# Patient Record
Sex: Male | Born: 1945 | Race: White | Hispanic: No | Marital: Married | Smoking: Former smoker
Health system: Southern US, Community
[De-identification: ages and names within clinical notes are randomized; demographics above are authoritative.]

## PROBLEM LIST (undated history)

## (undated) DIAGNOSIS — I10 Essential (primary) hypertension: Secondary | ICD-10-CM

## (undated) DIAGNOSIS — IMO0002 Reserved for concepts with insufficient information to code with codable children: Secondary | ICD-10-CM

## (undated) DIAGNOSIS — Z8619 Personal history of other infectious and parasitic diseases: Secondary | ICD-10-CM

## (undated) DIAGNOSIS — C189 Malignant neoplasm of colon, unspecified: Secondary | ICD-10-CM

## (undated) HISTORY — DX: Personal history of other infectious and parasitic diseases: Z86.19

## (undated) HISTORY — DX: Essential (primary) hypertension: I10

## (undated) HISTORY — PX: TOTAL THYMECTOMY: SHX2546

## (undated) HISTORY — PX: COLON SURGERY: SHX602

## (undated) HISTORY — DX: Reserved for concepts with insufficient information to code with codable children: IMO0002

---

## 1990-11-28 DIAGNOSIS — C189 Malignant neoplasm of colon, unspecified: Secondary | ICD-10-CM

## 1990-11-28 HISTORY — DX: Malignant neoplasm of colon, unspecified: C18.9

## 2004-11-04 ENCOUNTER — Ambulatory Visit: Payer: Self-pay | Admitting: Neurology

## 2004-11-12 ENCOUNTER — Ambulatory Visit: Payer: Self-pay | Admitting: Oncology

## 2004-11-28 ENCOUNTER — Ambulatory Visit: Payer: Self-pay | Admitting: Oncology

## 2005-01-19 ENCOUNTER — Ambulatory Visit: Payer: Self-pay | Admitting: Oncology

## 2005-08-05 ENCOUNTER — Ambulatory Visit: Payer: Self-pay | Admitting: Oncology

## 2005-08-28 ENCOUNTER — Ambulatory Visit: Payer: Self-pay | Admitting: Oncology

## 2006-09-22 ENCOUNTER — Ambulatory Visit: Payer: Self-pay | Admitting: Oncology

## 2006-09-28 ENCOUNTER — Ambulatory Visit: Payer: Self-pay | Admitting: Oncology

## 2007-11-29 ENCOUNTER — Ambulatory Visit: Payer: Self-pay | Admitting: Oncology

## 2007-12-06 ENCOUNTER — Ambulatory Visit: Payer: Self-pay | Admitting: Oncology

## 2007-12-30 ENCOUNTER — Ambulatory Visit: Payer: Self-pay | Admitting: Oncology

## 2008-01-11 ENCOUNTER — Ambulatory Visit: Payer: Self-pay | Admitting: Unknown Physician Specialty

## 2009-02-26 DIAGNOSIS — D4989 Neoplasm of unspecified behavior of other specified sites: Secondary | ICD-10-CM | POA: Insufficient documentation

## 2011-09-30 ENCOUNTER — Ambulatory Visit: Payer: Self-pay | Admitting: Family Medicine

## 2012-01-19 DIAGNOSIS — R918 Other nonspecific abnormal finding of lung field: Secondary | ICD-10-CM | POA: Diagnosis not present

## 2012-01-19 DIAGNOSIS — C37 Malignant neoplasm of thymus: Secondary | ICD-10-CM | POA: Diagnosis not present

## 2012-01-19 DIAGNOSIS — D150 Benign neoplasm of thymus: Secondary | ICD-10-CM | POA: Diagnosis not present

## 2012-03-09 DIAGNOSIS — I10 Essential (primary) hypertension: Secondary | ICD-10-CM | POA: Diagnosis not present

## 2012-03-09 DIAGNOSIS — Z23 Encounter for immunization: Secondary | ICD-10-CM | POA: Diagnosis not present

## 2012-03-09 DIAGNOSIS — R7309 Other abnormal glucose: Secondary | ICD-10-CM | POA: Diagnosis not present

## 2012-08-08 DIAGNOSIS — G7000 Myasthenia gravis without (acute) exacerbation: Secondary | ICD-10-CM | POA: Diagnosis not present

## 2012-08-09 DIAGNOSIS — M25569 Pain in unspecified knee: Secondary | ICD-10-CM | POA: Diagnosis not present

## 2012-08-09 DIAGNOSIS — G7000 Myasthenia gravis without (acute) exacerbation: Secondary | ICD-10-CM | POA: Diagnosis not present

## 2012-08-09 DIAGNOSIS — R7309 Other abnormal glucose: Secondary | ICD-10-CM | POA: Diagnosis not present

## 2012-08-09 DIAGNOSIS — I10 Essential (primary) hypertension: Secondary | ICD-10-CM | POA: Diagnosis not present

## 2012-10-12 DIAGNOSIS — H109 Unspecified conjunctivitis: Secondary | ICD-10-CM | POA: Diagnosis not present

## 2012-10-12 DIAGNOSIS — Z23 Encounter for immunization: Secondary | ICD-10-CM | POA: Diagnosis not present

## 2012-10-12 DIAGNOSIS — M25569 Pain in unspecified knee: Secondary | ICD-10-CM | POA: Diagnosis not present

## 2012-10-12 DIAGNOSIS — R7309 Other abnormal glucose: Secondary | ICD-10-CM | POA: Diagnosis not present

## 2012-10-12 DIAGNOSIS — G7000 Myasthenia gravis without (acute) exacerbation: Secondary | ICD-10-CM | POA: Diagnosis not present

## 2013-01-18 DIAGNOSIS — M1990 Unspecified osteoarthritis, unspecified site: Secondary | ICD-10-CM | POA: Diagnosis not present

## 2013-02-15 DIAGNOSIS — Z800 Family history of malignant neoplasm of digestive organs: Secondary | ICD-10-CM | POA: Diagnosis not present

## 2013-02-15 DIAGNOSIS — Z85048 Personal history of other malignant neoplasm of rectum, rectosigmoid junction, and anus: Secondary | ICD-10-CM | POA: Diagnosis not present

## 2013-03-12 DIAGNOSIS — M5430 Sciatica, unspecified side: Secondary | ICD-10-CM | POA: Diagnosis not present

## 2013-03-12 DIAGNOSIS — S336XXA Sprain of sacroiliac joint, initial encounter: Secondary | ICD-10-CM | POA: Diagnosis not present

## 2013-03-12 DIAGNOSIS — M5137 Other intervertebral disc degeneration, lumbosacral region: Secondary | ICD-10-CM | POA: Diagnosis not present

## 2013-03-12 DIAGNOSIS — M999 Biomechanical lesion, unspecified: Secondary | ICD-10-CM | POA: Diagnosis not present

## 2013-03-13 DIAGNOSIS — S336XXA Sprain of sacroiliac joint, initial encounter: Secondary | ICD-10-CM | POA: Diagnosis not present

## 2013-03-13 DIAGNOSIS — M5430 Sciatica, unspecified side: Secondary | ICD-10-CM | POA: Diagnosis not present

## 2013-03-13 DIAGNOSIS — M5137 Other intervertebral disc degeneration, lumbosacral region: Secondary | ICD-10-CM | POA: Diagnosis not present

## 2013-03-13 DIAGNOSIS — M999 Biomechanical lesion, unspecified: Secondary | ICD-10-CM | POA: Diagnosis not present

## 2013-03-14 DIAGNOSIS — M999 Biomechanical lesion, unspecified: Secondary | ICD-10-CM | POA: Diagnosis not present

## 2013-03-14 DIAGNOSIS — M5430 Sciatica, unspecified side: Secondary | ICD-10-CM | POA: Diagnosis not present

## 2013-03-14 DIAGNOSIS — S336XXA Sprain of sacroiliac joint, initial encounter: Secondary | ICD-10-CM | POA: Diagnosis not present

## 2013-03-14 DIAGNOSIS — M5137 Other intervertebral disc degeneration, lumbosacral region: Secondary | ICD-10-CM | POA: Diagnosis not present

## 2013-04-03 DIAGNOSIS — IMO0002 Reserved for concepts with insufficient information to code with codable children: Secondary | ICD-10-CM | POA: Diagnosis not present

## 2013-04-03 DIAGNOSIS — M1990 Unspecified osteoarthritis, unspecified site: Secondary | ICD-10-CM | POA: Diagnosis not present

## 2013-04-03 DIAGNOSIS — R7309 Other abnormal glucose: Secondary | ICD-10-CM | POA: Diagnosis not present

## 2013-04-03 DIAGNOSIS — K651 Peritoneal abscess: Secondary | ICD-10-CM | POA: Diagnosis not present

## 2013-04-19 ENCOUNTER — Ambulatory Visit: Payer: Self-pay | Admitting: Unknown Physician Specialty

## 2013-04-19 DIAGNOSIS — Z7982 Long term (current) use of aspirin: Secondary | ICD-10-CM | POA: Diagnosis not present

## 2013-04-19 DIAGNOSIS — K648 Other hemorrhoids: Secondary | ICD-10-CM | POA: Diagnosis not present

## 2013-04-19 DIAGNOSIS — Z801 Family history of malignant neoplasm of trachea, bronchus and lung: Secondary | ICD-10-CM | POA: Diagnosis not present

## 2013-04-19 DIAGNOSIS — Z85038 Personal history of other malignant neoplasm of large intestine: Secondary | ICD-10-CM | POA: Diagnosis not present

## 2013-04-19 DIAGNOSIS — Z85048 Personal history of other malignant neoplasm of rectum, rectosigmoid junction, and anus: Secondary | ICD-10-CM | POA: Diagnosis not present

## 2013-04-19 DIAGNOSIS — Z980 Intestinal bypass and anastomosis status: Secondary | ICD-10-CM | POA: Diagnosis not present

## 2013-04-19 DIAGNOSIS — D128 Benign neoplasm of rectum: Secondary | ICD-10-CM | POA: Diagnosis not present

## 2013-04-19 DIAGNOSIS — Z09 Encounter for follow-up examination after completed treatment for conditions other than malignant neoplasm: Secondary | ICD-10-CM | POA: Diagnosis not present

## 2013-04-19 DIAGNOSIS — K5730 Diverticulosis of large intestine without perforation or abscess without bleeding: Secondary | ICD-10-CM | POA: Diagnosis not present

## 2013-04-19 DIAGNOSIS — Z800 Family history of malignant neoplasm of digestive organs: Secondary | ICD-10-CM | POA: Diagnosis not present

## 2013-04-19 DIAGNOSIS — Z823 Family history of stroke: Secondary | ICD-10-CM | POA: Diagnosis not present

## 2013-04-19 DIAGNOSIS — Z79899 Other long term (current) drug therapy: Secondary | ICD-10-CM | POA: Diagnosis not present

## 2013-04-19 DIAGNOSIS — F17200 Nicotine dependence, unspecified, uncomplicated: Secondary | ICD-10-CM | POA: Diagnosis not present

## 2013-04-19 DIAGNOSIS — G7000 Myasthenia gravis without (acute) exacerbation: Secondary | ICD-10-CM | POA: Diagnosis not present

## 2013-04-19 LAB — HM COLONOSCOPY

## 2013-08-07 DIAGNOSIS — G7000 Myasthenia gravis without (acute) exacerbation: Secondary | ICD-10-CM | POA: Diagnosis not present

## 2013-12-13 DIAGNOSIS — Z23 Encounter for immunization: Secondary | ICD-10-CM | POA: Diagnosis not present

## 2013-12-13 DIAGNOSIS — I10 Essential (primary) hypertension: Secondary | ICD-10-CM | POA: Diagnosis not present

## 2013-12-13 DIAGNOSIS — M1990 Unspecified osteoarthritis, unspecified site: Secondary | ICD-10-CM | POA: Diagnosis not present

## 2013-12-13 DIAGNOSIS — R7309 Other abnormal glucose: Secondary | ICD-10-CM | POA: Diagnosis not present

## 2013-12-13 DIAGNOSIS — IMO0002 Reserved for concepts with insufficient information to code with codable children: Secondary | ICD-10-CM | POA: Diagnosis not present

## 2013-12-13 LAB — HEMOGLOBIN A1C: HEMOGLOBIN A1C: 5.1 % (ref 4.0–6.0)

## 2014-08-20 DIAGNOSIS — G7000 Myasthenia gravis without (acute) exacerbation: Secondary | ICD-10-CM | POA: Diagnosis not present

## 2015-08-21 DIAGNOSIS — Z8639 Personal history of other endocrine, nutritional and metabolic disease: Secondary | ICD-10-CM | POA: Diagnosis not present

## 2015-08-21 DIAGNOSIS — Z7952 Long term (current) use of systemic steroids: Secondary | ICD-10-CM | POA: Diagnosis not present

## 2015-08-21 DIAGNOSIS — G7000 Myasthenia gravis without (acute) exacerbation: Secondary | ICD-10-CM | POA: Diagnosis not present

## 2015-09-29 ENCOUNTER — Other Ambulatory Visit: Payer: Self-pay | Admitting: Family Medicine

## 2015-09-29 DIAGNOSIS — R7303 Prediabetes: Secondary | ICD-10-CM | POA: Insufficient documentation

## 2015-09-29 DIAGNOSIS — IMO0002 Reserved for concepts with insufficient information to code with codable children: Secondary | ICD-10-CM

## 2015-09-29 DIAGNOSIS — M179 Osteoarthritis of knee, unspecified: Secondary | ICD-10-CM | POA: Insufficient documentation

## 2015-09-29 DIAGNOSIS — M1710 Unilateral primary osteoarthritis, unspecified knee: Secondary | ICD-10-CM | POA: Insufficient documentation

## 2015-09-29 DIAGNOSIS — Z79899 Other long term (current) drug therapy: Secondary | ICD-10-CM | POA: Insufficient documentation

## 2015-09-29 DIAGNOSIS — L821 Other seborrheic keratosis: Secondary | ICD-10-CM | POA: Insufficient documentation

## 2015-09-29 HISTORY — DX: Reserved for concepts with insufficient information to code with codable children: IMO0002

## 2015-09-30 ENCOUNTER — Encounter: Payer: Self-pay | Admitting: Family Medicine

## 2015-09-30 ENCOUNTER — Ambulatory Visit (INDEPENDENT_AMBULATORY_CARE_PROVIDER_SITE_OTHER): Payer: Medicare Other | Admitting: Family Medicine

## 2015-09-30 VITALS — BP 140/80 | HR 60 | Temp 98.1°F | Resp 16 | Wt 255.0 lb

## 2015-09-30 DIAGNOSIS — M1710 Unilateral primary osteoarthritis, unspecified knee: Secondary | ICD-10-CM

## 2015-09-30 DIAGNOSIS — M179 Osteoarthritis of knee, unspecified: Secondary | ICD-10-CM | POA: Diagnosis not present

## 2015-09-30 DIAGNOSIS — Z23 Encounter for immunization: Secondary | ICD-10-CM | POA: Diagnosis not present

## 2015-09-30 DIAGNOSIS — I10 Essential (primary) hypertension: Secondary | ICD-10-CM | POA: Diagnosis not present

## 2015-09-30 DIAGNOSIS — R7303 Prediabetes: Secondary | ICD-10-CM | POA: Diagnosis not present

## 2015-09-30 MED ORDER — MELOXICAM 15 MG PO TABS: 15.0000 mg | ORAL_TABLET | Freq: Every day | ORAL | Status: DC

## 2015-09-30 MED ORDER — TRAMADOL HCL 50 MG PO TABS: 50.0000 mg | ORAL_TABLET | Freq: Three times a day (TID) | ORAL | Status: DC | PRN

## 2015-09-30 NOTE — Progress Notes (Signed)
Patient: Adam Chase Male    DOB: Dec 14, 1945   69 y.o.   MRN: [id number] Visit Date: 09/30/2015  Today's Provider: Lelon Huh, MD   Chief Complaint  Patient presents with  . Follow-up  . Hypertension   Subjective:    HPI  Follow-up for pre-diabetes from 12/13/2013; labs and no changes. He was seen at [facility] for follow up myasthenia in September when his A1c was found to be 5.3.    Hypertension, follow-up:  BP Readings from Last 3 Encounters:  09/30/15 140/80  12/13/13 134/80    He was last seen for hypertension 12/13/2013.  BP at that visit was 134/80. Management since that visit includes none. He reports good compliance with treatment. He is not having side effects. none  He is exercising. He is not adherent to low salt diet.   Outside blood pressures are none. He is experiencing none.  Patient denies none.   Cardiovascular risk factors include pre-diabetes.  Use of agents associated with hypertension: NSAIDS.     Weight trend: fluctuating a bit Wt Readings from Last 3 Encounters:  09/30/15 255 lb (115.667 kg)  12/13/13 252 lb (114.306 kg)    Current diet: in general, an "unhealthy" diet  ----------------------------------------------------------------------   Arthritis Complains of daily pain i both legs and left hip that is worse when walking, or standing for long periods of time. He has been taking several Aleve and Tylenol a day to control pain, but is still not controlled. Does not keep him awake at night. He would like to try another arthritis medication.     No Known Allergies Previous Medications   AMLODIPINE (NORVASC) 5 MG TABLET    Take 1 tablet by mouth daily.   ASPIRIN 81 MG TABLET    Take 1 tablet by mouth daily.   B COMPLEX-C (SUPER B COMPLEX PO)    Take 2 tablets by mouth daily.   CHOLECALCIFEROL (VITAMIN D) 1000 UNITS TABLET    Take 1 tablet by mouth daily.   HYDROCHLOROTHIAZIDE (HYDRODIURIL) 25 MG TABLET    TAKE 1 TABLET BY MOUTH  EVERY DAY   OMEGA-3 FATTY ACIDS (FISH OIL) 1000 MG CAPS    Take 1 capsule by mouth daily.   POTASSIUM CHLORIDE SA (KLOR-CON M20) 20 MEQ TABLET    Take 1 tablet by mouth daily.   PREDNISONE (DELTASONE) 10 MG TABLET    Take 1 tablet by mouth daily.    Review of Systems  Respiratory: Negative for shortness of breath.   Cardiovascular: Positive for leg swelling. Negative for chest pain and palpitations.  Musculoskeletal: Positive for arthralgias.       Hands, arms, and shoulder pain.  Neurological: Negative for dizziness and light-headedness.    Social History  Substance Use Topics  . Smoking status: Former Smoker -- 0.75 packs/day for 25 years    Types: Cigarettes    Quit date: 11/28/1990  . Smokeless tobacco: Current User    Types: Snuff     Comment: has used snuff for 10-20 years; 1 can every 3 days  . Alcohol Use: 0.0 oz/week    0 Standard drinks or equivalent per week     Comment: occasional use   Objective:   BP 140/80 mmHg  Pulse 60  Temp(Src) 98.1 F (36.7 C) (Oral)  Resp 16  Wt 255 lb (115.667 kg)  SpO2 98%  Physical Exam   General Appearance:    Alert, cooperative, no distress  Eyes:  PERRL, conjunctiva/corneas clear, EOM's intact       Lungs:     Clear to auscultation bilaterally, respirations unlabored  Heart:    Regular rate and rhythm  Neurologic:   Awake, alert, oriented x 3. No apparent focal neurological           defect.   MS:   Minimal swelling of knees. No erythema. Mild joint line tenderness.        Assessment & Plan:     1. Essential (primary) hypertension Fairly well controlled. Continue current medications.   - Lipid panel - Renal function panel  2. Osteoarthritis of knee, unspecified laterality, unspecified osteoarthritis type Try maintenance NSAID and prn tramadol - meloxicam (MOBIC) 15 MG tablet; Take 1 tablet (15 mg total) by mouth daily. Take with food  Dispense: 30 tablet; Refill: 5 - traMADol (ULTRAM) 50 MG tablet; Take 1 tablet  (50 mg total) by mouth every 8 (eight) hours as needed.  Dispense: 60 tablet; Refill: 2  Consider orthopedic referral if not doing better with medications.   3. Prediabetes Normal A1c of 5.3 when checked at [facility] in September  4. Need for influenza vaccination  - Flu vaccine HIGH DOSE PF  5. Need for pneumococcal vaccination  - Pneumococcal polysaccharide vaccine 23-valent greater than or equal to 2yo subcutaneous/IM       Lelon Huh, MD  [facility]

## 2015-10-01 LAB — RENAL FUNCTION PANEL
ALBUMIN: 4.1 g/dL (ref 3.6–4.8)
BUN / CREAT RATIO: 12 (ref 10–22)
BUN: 12 mg/dL (ref 8–27)
CALCIUM: 9.5 mg/dL (ref 8.6–10.2)
CHLORIDE: 102 mmol/L (ref 97–106)
CO2: 26 mmol/L (ref 18–29)
Creatinine, Ser: 0.98 mg/dL (ref 0.76–1.27)
GFR calc non Af Amer: 78 mL/min/{1.73_m2} (ref >59)
GFR, EST AFRICAN AMERICAN: 91 mL/min/{1.73_m2} (ref >59)
GLUCOSE: 96 mg/dL (ref 65–99)
POTASSIUM: 3.9 mmol/L (ref 3.5–5.2)
Phosphorus: 2.7 mg/dL (ref 2.5–4.5)
Sodium: 143 mmol/L (ref 136–144)

## 2015-10-01 LAB — LIPID PANEL
CHOLESTEROL TOTAL: 154 mg/dL (ref 100–199)
Chol/HDL Ratio: 1.9 ratio units (ref 0.0–5.0)
HDL: 83 mg/dL (ref >39)
LDL CALC: 53 mg/dL (ref 0–99)
Triglycerides: 90 mg/dL (ref 0–149)
VLDL CHOLESTEROL CAL: 18 mg/dL (ref 5–40)

## 2015-10-30 ENCOUNTER — Other Ambulatory Visit: Payer: Self-pay | Admitting: Family Medicine

## 2015-10-30 DIAGNOSIS — I10 Essential (primary) hypertension: Secondary | ICD-10-CM

## 2016-02-14 ENCOUNTER — Other Ambulatory Visit: Payer: Self-pay | Admitting: Family Medicine

## 2016-02-14 DIAGNOSIS — I10 Essential (primary) hypertension: Secondary | ICD-10-CM

## 2016-02-22 ENCOUNTER — Ambulatory Visit (INDEPENDENT_AMBULATORY_CARE_PROVIDER_SITE_OTHER): Payer: Medicare Other | Admitting: Family Medicine

## 2016-02-22 ENCOUNTER — Encounter: Payer: Self-pay | Admitting: Family Medicine

## 2016-02-22 VITALS — BP 140/72 | HR 66 | Temp 98.0°F | Resp 16 | Wt 259.0 lb

## 2016-02-22 DIAGNOSIS — M25521 Pain in right elbow: Secondary | ICD-10-CM | POA: Diagnosis not present

## 2016-02-22 DIAGNOSIS — M7021 Olecranon bursitis, right elbow: Secondary | ICD-10-CM

## 2016-02-22 MED ORDER — CEPHALEXIN 500 MG PO CAPS: 500.0000 mg | ORAL_CAPSULE | Freq: Four times a day (QID) | ORAL | Status: AC

## 2016-02-22 MED ORDER — METHYLPREDNISOLONE ACETATE 40 MG/ML IJ SUSP: 40.0000 mg | Freq: Once | INTRAMUSCULAR | Status: DC

## 2016-02-22 NOTE — Patient Instructions (Signed)
Elbow Bursitis  Elbow bursitis is inflammation of the fluid-filled sac (bursa) between the tip of your elbow bone (olecranon) and your skin. Elbow bursitis may also be called olecranon bursitis.  Normally, the olecranon bursa has only a small amount of fluid in it to cushion and protect your elbow bone. Elbow bursitis causes fluid to build up inside the bursa. Over time, this swelling and inflammation can cause pain when you bend or lean on your elbow.   CAUSES  Elbow bursitis may be caused by:    Elbow injury (acute trauma).   Leaning on hard surfaces for long periods of time.   Infection from an injury that breaks the skin near your elbow.   A bone growth (spur) that forms at the tip of your elbow.   A medical condition that causes inflammation in your body, such as gout or rheumatoid arthritis.   The cause may also be unknown.   SIGNS AND SYMPTOMS   The first sign of elbow bursitis is usually swelling over the tip of your elbow. This can grow to be the size of a golf ball. This may start suddenly or develop gradually. You may also have:   Pain when bending or leaning on your elbow.   Restricted movement of your elbow.   If your bursitis is caused by an infection, symptoms may also include:   Redness, warmth, and tenderness of the elbow.   Drainage of pus from the swollen area over your elbow, if the skin breaks open.  DIAGNOSIS   Your health care provider may be able to diagnose elbow bursitis based on your signs and symptoms, especially if you have recently been injured. Your health care provider will also do a physical exam. This may include:   X-rays to look for a bone spur or a bone fracture.   Draining fluid from the bursa to test it for infection.   Blood tests to rule out gout or rheumatoid arthritis.  TREATMENT   Treatment for elbow bursitis depends on the cause. Treatment may include:   Medicines. These may include:    Over-the-counter medicines to relieve pain and inflammation.     Antibiotic medicines to fight infection.    Injections of anti-inflammatory medicines (steroids).   Wrapping your elbow with a bandage.   Draining fluid from the bursa.   Wearing elbow pads.   If your bursitis does not get better with treatment, surgery may be needed to remove the bursa.   HOME CARE INSTRUCTIONS    Take medicines only as directed by your health care provider.   If you were prescribed an antibiotic medicine, finish all of it even if you start to feel better.   If your bursitis is caused by an injury, rest your elbow and wear your bandage as directed by your health care provider. You may alsoapply ice to the injured area as directed by your health care provider:   Put ice in a plastic bag.   Place a towel between your skin and the bag.   Leave the ice on for 20 minutes, 2-3 times per day.   Avoid any activities that cause elbow pain.   Use elbow pads or elbow wraps to cushion your elbow.  SEEK MEDICAL CARE IF:   You have a fever.    Your symptoms do not get better with treatment.   Your pain or swelling gets worse.   Your elbow pain or swelling goes away and then returns.   You have   drainage of pus from the swollen area over your elbow.     This information is not intended to replace advice given to you by your health care provider. Make sure you discuss any questions you have with your health care provider.     Document Released: 12/14/2006 Document Revised: 12/05/2014 Document Reviewed: 07/23/2014  Elsevier Interactive Patient Education 2016 Elsevier Inc[employer]

## 2016-02-22 NOTE — Progress Notes (Signed)
Patient: Adam Chase Male    DOB: 14-Feb-1946   70 y.o.   MRN: [id number] Visit Date: 02/22/2016  Today's Provider: Lelon Huh, MD   Chief Complaint  Patient presents with  . Joint Swelling    right elbow x 2 months   Subjective:    HPI  Swelling of the Elbow: Patient comes in with swelling of the right elbow. Patient reports the swelling has been there for 2 months or so. In the past 2 weeks he has started to develop tenderness in the right elbow. Patient has tried icing his elbow with no relief of the pain. Patient has never experienced swelling of the elbow in the past.     No Known Allergies Previous Medications   AMLODIPINE (NORVASC) 5 MG TABLET    TAKE 1 TABLET BY MOUTH EVERY DAY   ASPIRIN 81 MG TABLET    Take 1 tablet by mouth daily.   B COMPLEX-C (SUPER B COMPLEX PO)    Take 2 tablets by mouth daily.   CHOLECALCIFEROL (VITAMIN D) 1000 UNITS TABLET    Take 1 tablet by mouth daily.   HYDROCHLOROTHIAZIDE (HYDRODIURIL) 25 MG TABLET    TAKE 1 TABLET BY MOUTH EVERY DAY   MELOXICAM (MOBIC) 15 MG TABLET    Take 1 tablet (15 mg total) by mouth daily. Take with food   OMEGA-3 FATTY ACIDS (FISH OIL) 1000 MG CAPS    Take 1 capsule by mouth daily.   POTASSIUM CHLORIDE SA (KLOR-CON M20) 20 MEQ TABLET    Take 1 tablet by mouth daily.   PREDNISONE (DELTASONE) 10 MG TABLET    Take 1 tablet by mouth daily.   TRAMADOL (ULTRAM) 50 MG TABLET    Take 1 tablet (50 mg total) by mouth every 8 (eight) hours as needed.    Review of Systems  Constitutional: Negative for fever, chills and appetite change.  Respiratory: Negative for chest tightness, shortness of breath and wheezing.   Cardiovascular: Negative for chest pain and palpitations.  Gastrointestinal: Negative for nausea, vomiting and abdominal pain.  Musculoskeletal: Positive for joint swelling and arthralgias (pain in right elbow).    Social History  Substance Use Topics  . Smoking status: Former Smoker -- 0.75 packs/day  for 25 years    Types: Cigarettes    Quit date: 11/28/1990  . Smokeless tobacco: Current User    Types: Snuff     Comment: has used snuff for 10-20 years; 1 can every 3 days  . Alcohol Use: 0.0 oz/week    0 Standard drinks or equivalent per week     Comment: occasional use   Objective:   BP 140/72 mmHg  Pulse 66  Temp(Src) 98 F (36.7 C) (Oral)  Resp 16  Wt 259 lb (117.482 kg)  SpO2 98%  Physical Exam  General appearance: alert, well developed, well nourished, cooperative and in no distress Ext: Golf ball size fluctuate mass, slightly tender over right olecranon.     Assessment & Plan:     1. Elbow pain, right   2. Olecranon bursitis of right elbow Prepped skin over olecranon with isopropyl alcohol, aspirated about 11 mL slightly cloudy, serosanguinous fluid. Injected 1/2 mL 40mg /ml Depot-Medrol. Patient tolerated well. Sent specimen for culture. Cover with cephalexin while awaiting cultures.   - methylPREDNISolone acetate (DEPO-MEDROL) injection 40 mg; 1 mL (40 mg total) by Intra-Lesional route once. - cephALEXin (KEFLEX) 500 MG capsule; Take 1 capsule (500 mg total) by mouth 4 (  four) times daily.  Dispense: 28 capsule; Refill: 0       Lelon Huh, MD  [facility]

## 2016-02-24 ENCOUNTER — Other Ambulatory Visit: Payer: Self-pay | Admitting: Family Medicine

## 2016-02-26 LAB — BODY FLUID CULTURE

## 2016-03-30 ENCOUNTER — Other Ambulatory Visit: Payer: Self-pay | Admitting: Family Medicine

## 2016-04-19 ENCOUNTER — Encounter: Payer: Self-pay | Admitting: Family Medicine

## 2016-04-19 ENCOUNTER — Ambulatory Visit (INDEPENDENT_AMBULATORY_CARE_PROVIDER_SITE_OTHER): Payer: Medicare Other | Admitting: Family Medicine

## 2016-04-19 VITALS — BP 124/70 | HR 55 | Temp 97.9°F | Resp 16 | Wt 261.0 lb

## 2016-04-19 DIAGNOSIS — M25552 Pain in left hip: Secondary | ICD-10-CM | POA: Diagnosis not present

## 2016-04-19 DIAGNOSIS — M7061 Trochanteric bursitis, right hip: Secondary | ICD-10-CM | POA: Diagnosis not present

## 2016-04-19 DIAGNOSIS — M7062 Trochanteric bursitis, left hip: Secondary | ICD-10-CM | POA: Insufficient documentation

## 2016-04-19 MED ORDER — METHYLPREDNISOLONE ACETATE 80 MG/ML IJ SUSP
80.0000 mg | Freq: Once | INTRAMUSCULAR | Status: AC
  Administered 2016-04-19: 80 mg via INTRAMUSCULAR

## 2016-04-19 NOTE — Progress Notes (Signed)
Patient: Adam Chase Male    DOB: 01/26/1946   70 y.o.   MRN: [id number] Visit Date: 04/19/2016  Today's Provider: Lelon Huh, MD   No chief complaint on file.  Subjective:    Hip Pain  The incident occurred more than 1 week ago. There was no injury mechanism. The pain is present in the left hip. The quality of the pain is described as stabbing. The pain is at a severity of 10/10. The pain is severe. The pain has been constant since onset. Associated symptoms include an inability to bear weight, a loss of motion and muscle weakness. Pertinent negatives include no loss of sensation, numbness or tingling. He reports no foreign bodies present. The symptoms are aggravated by weight bearing and movement. He has tried ice and heat (MUSCLE gel deep blue) for the symptoms. The treatment provided mild relief.   Left hip pain for 1-2 years. Hip pain has been gradually worsening. Pain describes as severe. Pain in left buttock, hip and upper leg. Patient states that pain is worse when he puts weight on his leg. Patient has been using heat, ice and muscle gel, with mild results.    No Known Allergies Previous Medications   AMLODIPINE (NORVASC) 5 MG TABLET    TAKE 1 TABLET BY MOUTH EVERY DAY   ASPIRIN 81 MG TABLET    Take 1 tablet by mouth daily.   B COMPLEX-C (SUPER B COMPLEX PO)    Take 2 tablets by mouth daily.   CHOLECALCIFEROL (VITAMIN D) 1000 UNITS TABLET    Take 1 tablet by mouth daily.   HYDROCHLOROTHIAZIDE (HYDRODIURIL) 25 MG TABLET    TAKE 1 TABLET BY MOUTH EVERY DAY   MELOXICAM (MOBIC) 15 MG TABLET    TAKE 1 TABLET BY MOUTH DAILY   OMEGA-3 FATTY ACIDS (FISH OIL) 1000 MG CAPS    Take 1 capsule by mouth daily.   POTASSIUM CHLORIDE SA (KLOR-CON M20) 20 MEQ TABLET    Take 1 tablet by mouth daily.   PREDNISONE (DELTASONE) 10 MG TABLET    Take 1 tablet by mouth daily.   TRAMADOL (ULTRAM) 50 MG TABLET    Take 1 tablet (50 mg total) by mouth every 8 (eight) hours as needed.    Review  of Systems  Constitutional: Negative for fever, chills and appetite change.  Respiratory: Negative for chest tightness, shortness of breath and wheezing.   Cardiovascular: Negative for chest pain and palpitations.  Gastrointestinal: Negative for nausea, vomiting and abdominal pain.  Musculoskeletal: Positive for myalgias and gait problem.  Neurological: Negative for tingling and numbness.    Social History  Substance Use Topics  . Smoking status: Former Smoker -- 0.75 packs/day for 25 years    Types: Cigarettes    Quit date: 11/28/1990  . Smokeless tobacco: Current User    Types: Snuff     Comment: has used snuff for 10-20 years; 1 can every 3 days  . Alcohol Use: 0.0 oz/week    0 Standard drinks or equivalent per week     Comment: occasional use   Objective:   BP 124/70 mmHg  Pulse 55  Temp(Src) 97.9 F (36.6 C) (Oral)  Resp 16  Wt 261 lb (118.389 kg)  SpO2 99%  Physical Exam  General appearance: alert, well developed, well nourished, cooperative and in no distress Head: Normocephalic, without obvious abnormality, atraumatic  Extremities: Moderate point tenderness over left greater trochanter. No gross deformity.  Assessment & Plan:     1. Left hip pain   2. Trochanteric bursitis of right hip Prepped skin over greater trochanter with isopropyl alcohol and injection 80mg  depo-medrol mixed with 1 cc 1% lidocaine withOUT epi over area. Patient tolerated well.  - methylPREDNISolone acetate (DEPO-MEDROL) injection 80 mg; Inject 1 mL (80 mg total) into the muscle once.   Call if symptoms change or if not rapidly improving.    Consider orthopedic referral.       Lelon Huh, MD  [facility]

## 2016-04-19 NOTE — Patient Instructions (Signed)
Hip Bursitis Bursitis is a swelling and soreness (inflammation) of a fluid-filled sac (bursa). This sac overlies and protects the joints.  CAUSES   Injury.  Overuse of the muscles surrounding the joint.  Arthritis.  Gout.  Infection.  Cold weather.  Inadequate warm-up and conditioning prior to activities. The cause may not be known.  SYMPTOMS   Mild to severe irritation.  Tenderness and swelling over the outside of the hip.  Pain with motion of the hip.  If the bursa becomes infected, a fever may be present. Redness, tenderness, and warmth will develop over the hip. Symptoms usually lessen in 3 to 4 weeks with treatment, but can come back. TREATMENT If conservative treatment does not work, your caregiver may advise draining the bursa and injecting cortisone into the area. This may speed up the healing process. This may also be used as an initial treatment of choice. HOME CARE INSTRUCTIONS   Apply ice to the affected area for 15-20 minutes every 3 to 4 hours while awake for the first 2 days. Put the ice in a plastic bag and place a towel between the bag of ice and your skin.  Rest the painful joint as much as possible, but continue to put the joint through a normal range of motion at least 4 times per day. When the pain lessens, begin normal, slow movements and usual activities to help prevent stiffness of the hip.  Only take over-the-counter or prescription medicines for pain, discomfort, or fever as directed by your caregiver.  Use crutches to limit weight bearing on the hip joint, if advised.  Elevate your painful hip to reduce swelling. Use pillows for propping and cushioning your legs and hips.  Gentle massage may provide comfort and decrease swelling. SEEK IMMEDIATE MEDICAL CARE IF:   Your pain increases even during treatment, or you are not improving.  You have a fever.  You have heat and inflammation over the involved bursa.  You have any other questions or  concerns. MAKE SURE YOU:   Understand these instructions.  Will watch your condition.  Will get help right away if you are not doing well or get worse.   This information is not intended to replace advice given to you by your health care provider. Make sure you discuss any questions you have with your health care provider.   Document Released: 05/06/2002 Document Revised: 02/06/2012 Document Reviewed: 06/16/2015 Elsevier Interactive Patient Education 2016 Elsevier Inc.  

## 2016-06-07 ENCOUNTER — Other Ambulatory Visit: Payer: Self-pay

## 2016-06-07 MED ORDER — MELOXICAM 15 MG PO TABS: 15.0000 mg | ORAL_TABLET | Freq: Every day | ORAL | Status: DC

## 2016-06-07 NOTE — Telephone Encounter (Signed)
Pharmacy faxed refill request. 

## 2016-07-06 DIAGNOSIS — D150 Benign neoplasm of thymus: Secondary | ICD-10-CM | POA: Diagnosis not present

## 2016-07-06 DIAGNOSIS — G7000 Myasthenia gravis without (acute) exacerbation: Secondary | ICD-10-CM | POA: Diagnosis not present

## 2016-07-13 ENCOUNTER — Other Ambulatory Visit: Payer: Self-pay | Admitting: Family Medicine

## 2016-07-13 DIAGNOSIS — M179 Osteoarthritis of knee, unspecified: Secondary | ICD-10-CM

## 2016-07-13 DIAGNOSIS — M1710 Unilateral primary osteoarthritis, unspecified knee: Secondary | ICD-10-CM

## 2016-07-13 MED ORDER — TRAMADOL HCL 50 MG PO TABS: 50.0000 mg | ORAL_TABLET | Freq: Three times a day (TID) | ORAL | 2 refills | Status: DC | PRN

## 2016-07-13 NOTE — Telephone Encounter (Signed)
Please call in tramadol.  

## 2016-07-13 NOTE — Telephone Encounter (Signed)
Pt called needing refill on his arthritis medication.  He does not know the name of what he is taking.  He states he does not take it all the time but is having pain in his left leg.  He uses [facility] [place]  His call back is [phone]  Thanks teri

## 2016-07-13 NOTE — Telephone Encounter (Signed)
Called in. Abuk Selleck Drozdowski, CMA  

## 2016-08-22 ENCOUNTER — Encounter: Payer: Medicare Other | Admitting: Family Medicine

## 2016-09-06 ENCOUNTER — Telehealth: Payer: Self-pay | Admitting: Family Medicine

## 2016-09-06 NOTE — Telephone Encounter (Signed)
Called Pt to schedule AWV with [facility] for 10/19 -knb

## 2016-09-15 ENCOUNTER — Ambulatory Visit (INDEPENDENT_AMBULATORY_CARE_PROVIDER_SITE_OTHER): Payer: Medicare Other | Admitting: Family Medicine

## 2016-09-15 ENCOUNTER — Encounter: Payer: Self-pay | Admitting: Family Medicine

## 2016-09-15 VITALS — BP 120/60 | HR 83 | Temp 98.0°F | Resp 16 | Ht 72.0 in | Wt 256.0 lb

## 2016-09-15 DIAGNOSIS — Z79899 Other long term (current) drug therapy: Secondary | ICD-10-CM | POA: Diagnosis not present

## 2016-09-15 DIAGNOSIS — E876 Hypokalemia: Secondary | ICD-10-CM | POA: Diagnosis not present

## 2016-09-15 DIAGNOSIS — M1710 Unilateral primary osteoarthritis, unspecified knee: Secondary | ICD-10-CM

## 2016-09-15 DIAGNOSIS — M179 Osteoarthritis of knee, unspecified: Secondary | ICD-10-CM

## 2016-09-15 DIAGNOSIS — R7303 Prediabetes: Secondary | ICD-10-CM

## 2016-09-15 DIAGNOSIS — D4989 Neoplasm of unspecified behavior of other specified sites: Secondary | ICD-10-CM

## 2016-09-15 DIAGNOSIS — G7000 Myasthenia gravis without (acute) exacerbation: Secondary | ICD-10-CM

## 2016-09-15 DIAGNOSIS — M25552 Pain in left hip: Secondary | ICD-10-CM | POA: Diagnosis not present

## 2016-09-15 DIAGNOSIS — Z0000 Encounter for general adult medical examination without abnormal findings: Secondary | ICD-10-CM | POA: Diagnosis not present

## 2016-09-15 DIAGNOSIS — R5383 Other fatigue: Secondary | ICD-10-CM

## 2016-09-15 DIAGNOSIS — D150 Benign neoplasm of thymus: Secondary | ICD-10-CM

## 2016-09-15 DIAGNOSIS — Z23 Encounter for immunization: Secondary | ICD-10-CM

## 2016-09-15 DIAGNOSIS — I10 Essential (primary) hypertension: Secondary | ICD-10-CM

## 2016-09-15 DIAGNOSIS — M7062 Trochanteric bursitis, left hip: Secondary | ICD-10-CM

## 2016-09-15 MED ORDER — TRAMADOL HCL 50 MG PO TABS: 50.0000 mg | ORAL_TABLET | Freq: Three times a day (TID) | ORAL | 5 refills | Status: DC | PRN

## 2016-09-15 NOTE — Progress Notes (Signed)
Patient: Adam Chase, Male    DOB: [birth year], 70 y.o.   MRN: [id number] Visit Date: 09/15/2016  Today's Provider: Lelon Huh, MD   Chief Complaint  Patient presents with  . Medicare Wellness  . Hypertension  . Hip Pain   Subjective:    Annual wellness visit Adam Chase is a 70 y.o. male. He feels well. He reports exercising yes. He reports he is sleeping well.  -----------------------------------------------------------   Prediabetes: Lab Results  Component Value Date   HGBA1C 5.1 12/13/2013   Follow up hip pain Was given injection steroid for trochanteric bursitis in May which relieved for 2 weeks. Since then was called in tramadol which he states works very well. Is taking at least one every day, occasionally 2 or 3. No adverse effects.      Hypertension, follow-up:  BP Readings from Last 3 Encounters:  09/15/16 120/60  04/19/16 124/70  02/22/16 140/72    He was last seen for hypertension 11 months ago.  BP at that visit was 140/80. Management since that visit includes; no changes.He reports good compliance with treatment. He is not having side effects. none He is exercising. He is adherent to low salt diet.   Outside blood pressures are normal. He is experiencing none.  Patient denies none.   Cardiovascular risk factors include none.  Use of agents associated with hypertension: none.   ----------------------------------------------------------------     Review of Systems  Constitutional: Negative.   HENT: Positive for hearing loss.   Eyes: Negative.   Respiratory: Positive for shortness of breath.   Cardiovascular: Negative.   Gastrointestinal: Negative.   Endocrine: Negative.   Genitourinary: Negative.   Musculoskeletal: Positive for arthralgias.  Skin: Negative.   Allergic/Immunologic: Negative.   Neurological: Negative.   Hematological: Negative.   Psychiatric/Behavioral: Negative.     Social History   Social  History  . Marital status: Married    Spouse name: N/A  . Number of children: 2  . Years of education: N/A   Occupational History  . Glass blower/designer; [employer]    Social History Main Topics  . Smoking status: Former Smoker    Packs/day: 0.75    Years: 25.00    Types: Cigarettes    Quit date: 11/28/1990  . Smokeless tobacco: Current User    Types: Snuff     Comment: has used snuff for 10-20 years; 1 can every 3 days  . Alcohol use 0.0 oz/week     Comment: occasional use  . Drug use: No  . Sexual activity: Not on file   Other Topics Concern  . Not on file   Social History Narrative  . No narrative on file    Past Medical History:  Diagnosis Date  . Abdominal abscess ([place]) 09/29/2015  . History of measles   . History of mumps      Patient Active Problem List   Diagnosis Date Noted  . Left hip pain 04/19/2016  . Trochanteric bursitis of left hip 04/19/2016  . Trochanteric bursitis of right hip 04/19/2016  . Osteoarthritis of knee 09/29/2015  . Other long term (current) drug therapy 09/29/2015  . Prediabetes 09/29/2015  . Seborrheic keratosis 09/29/2015  . Personal history of colon cancer 03/02/2009  . History of tobacco use 03/02/2009  . Essential (primary) hypertension 02/26/2009  . 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 [place]  .  THYROIDECTOMY  2000's   Total    His family history includes Colon cancer in his other; Heart attack in his other; Lung cancer in his other; Stroke in his mother.    Current Meds  Medication Sig  . amLODipine (NORVASC) 5 MG tablet TAKE 1 TABLET BY MOUTH EVERY DAY  . aspirin 81 MG tablet Take 1 tablet by mouth daily.  . B Complex-C (SUPER B COMPLEX PO) Take 2 tablets by mouth daily.  . cholecalciferol (VITAMIN D) 1000 UNITS tablet Take 1 tablet by mouth daily.  . hydrochlorothiazide (HYDRODIURIL) 25 MG tablet TAKE 1 TABLET BY MOUTH  EVERY DAY  . meloxicam (MOBIC) 15 MG tablet Take 1 tablet (15 mg total) by mouth daily.  . Omega-3 Fatty Acids (FISH OIL) 1000 MG CAPS Take 1 capsule by mouth daily.  . potassium chloride SA (KLOR-CON M20) 20 MEQ tablet Take 1 tablet by mouth daily.  . predniSONE (DELTASONE) 10 MG tablet Take 1 tablet by mouth daily.   Current Facility-Administered Medications for the 09/15/16 encounter (Office Visit) with Birdie Sons, MD  Medication  . methylPREDNISolone acetate (DEPO-MEDROL) injection 40 mg    Patient Care Team: Birdie Sons, MD as PCP - General (Family Medicine) Vern Marlon Pel, MD as Referring Physician (Neurology)    Objective:   Vitals: BP 120/60 (BP Location: Right Arm, Patient Position: Sitting, Cuff Size: Large)   Pulse 83   Temp 98 F (36.7 C) (Oral)   Resp 16   Ht 6' (1.829 m)   Wt 256 lb (116.1 kg)   SpO2 97%   BMI 34.72 kg/m   Physical Exam   General Appearance:    Alert, cooperative, no distress, obese  Eyes:    PERRL, conjunctiva/corneas clear, EOM's intact       Lungs:     Clear to auscultation bilaterally, respirations unlabored  Heart:    Regular rate and rhythm  Neurologic:   Awake, alert, oriented x 3. No apparent focal neurological           defect.        Activities of Daily Living In your present state of health, do you have any difficulty performing the following activities: 09/15/2016  Hearing? Y  Vision? N  Difficulty concentrating or making decisions? N  Walking or climbing stairs? Y  Dressing or bathing? N  Doing errands, shopping? N  Some recent data might be hidden    Fall Risk Assessment Fall Risk  09/15/2016  Falls in the past year? No     Depression Screen PHQ 2/9 Scores 09/15/2016  PHQ - 2 Score 0    Cognitive Testing - 6-CIT  Correct? Score   What year is it? yes 0 0 or 4  What month is it? yes 0 0 or 3  Memorize:    Adam Chase,  42,  [place],      What time is it? (within 1 hour) yes 0 0 or 3    Count backwards from 20 yes 0 0, 2, or 4  Name the months of the year yes 0 0, 2, or 4  Repeat name & address above yes 0 0, 2, 4, 6, 8, or 10       TOTAL SCORE  0/28   Interpretation:  Normal  Normal (0-7) Abnormal (8-28)    Audit-C Alcohol Use Screening  Question Answer Points  How often do you have alcoholic drink? 4 or more times weekly 4  On  days you do drink alcohol, how many drinks do you typically consume? 1 or 2 0  How oftey will you drink 6 or more in a total? less than 1 times monthly 1  Total Score:  5   A score of 3 or more in women, and 4 or more in men indicates increased risk for alcohol abuse, EXCEPT if all of the points are from question 1.     Assessment & Plan:     Annual Wellness Visit  Reviewed patient's Family Medical History Reviewed and updated list of patient's medical providers Assessment of cognitive impairment was done Assessed patient's functional ability Established a written schedule for health screening [place] Completed and Reviewed  Exercise Activities and Dietary recommendations Goals    None      Immunization History  Administered Date(s) Administered  . Influenza, High Dose Seasonal PF 09/30/2015  . Pneumococcal Conjugate-13 12/13/2013  . Pneumococcal Polysaccharide-23 09/30/2015  . Td 09/08/2011    Health Maintenance  Topic Date Due  . Hepatitis C Screening  Sep 16, 1946  . ZOSTAVAX  03/29/2006  . COLONOSCOPY  04/19/2018  . TETANUS/TDAP  09/07/2021  . INFLUENZA VACCINE  Completed  . PNA vac Low Risk Adult  Completed      Discussed health benefits of physical activity, and encouraged him to engage in regular exercise appropriate for his age and condition.    --------------------------------------------------------------------------  1. Medicare annual wellness visit, subsequent   2. Need for influenza vaccination  - Flu vaccine HIGH DOSE PF  3. Osteoarthritis of knee, unspecified  laterality, unspecified osteoarthritis type  - traMADol (ULTRAM) 50 MG tablet; Take 1 tablet (50 mg total) by mouth every 8 (eight) hours as needed.  Dispense: 60 tablet; Refill: 5  4. Other long term (current) drug therapy On chronic prednisone for myasthenia. States up to date on BMD at [facility]  5. Essential (primary) hypertension Well controlled.  Continue current medications.   - EKG 12-Lead  6. Myasthenia gravis associated with thymoma ([place]) Continue routine follow up [place]  7. Trochanteric bursitis of left hip He is very happy with pain control from tramadol which was refilled today.   8. Left hip pain   9. Fatigue, unspecified type  - Comprehensive metabolic panel - TSH  10. Prediabetes  - Hemoglobin A1c   The entirety of the information documented in the History of Present Illness, Review of Systems and Physical Exam were personally obtained by me. Portions of this information were initially documented by April M. Sabra Heck, CMA and reviewed by me for thoroughness and accuracy.    Lelon Huh, MD  [facility]

## 2016-09-16 LAB — COMPREHENSIVE METABOLIC PANEL
ALT: 30 IU/L (ref 0–44)
AST: 29 IU/L (ref 0–40)
Albumin/Globulin Ratio: 1.7 (ref 1.2–2.2)
Albumin: 4 g/dL (ref 3.5–4.8)
Alkaline Phosphatase: 55 IU/L (ref 39–117)
BUN/Creatinine Ratio: 12 (ref 10–24)
BUN: 12 mg/dL (ref 8–27)
Bilirubin Total: 0.5 mg/dL (ref 0.0–1.2)
CALCIUM: 9.5 mg/dL (ref 8.6–10.2)
CHLORIDE: 98 mmol/L (ref 96–106)
CO2: 25 mmol/L (ref 18–29)
Creatinine, Ser: 1.04 mg/dL (ref 0.76–1.27)
GFR, EST AFRICAN AMERICAN: 84 mL/min/{1.73_m2} (ref >59)
GFR, EST NON AFRICAN AMERICAN: 72 mL/min/{1.73_m2} (ref >59)
GLUCOSE: 111 mg/dL — AB (ref 65–99)
Globulin, Total: 2.4 g/dL (ref 1.5–4.5)
Potassium: 3.1 mmol/L — ABNORMAL LOW (ref 3.5–5.2)
Sodium: 140 mmol/L (ref 134–144)
TOTAL PROTEIN: 6.4 g/dL (ref 6.0–8.5)

## 2016-09-16 LAB — TSH: TSH: 0.544 u[IU]/mL (ref 0.450–4.500)

## 2016-09-16 LAB — HEMOGLOBIN A1C
ESTIMATED AVERAGE GLUCOSE: 108 mg/dL
Hgb A1c MFr Bld: 5.4 % (ref 4.8–5.6)

## 2016-09-20 ENCOUNTER — Telehealth: Payer: Self-pay

## 2016-09-20 LAB — MAGNESIUM: MAGNESIUM: 1.7 mg/dL (ref 1.6–2.3)

## 2016-09-20 LAB — SPECIMEN STATUS REPORT

## 2016-09-20 NOTE — Telephone Encounter (Signed)
Left message to call back  

## 2016-09-20 NOTE — Telephone Encounter (Signed)
-----   Message from Birdie Sons, MD sent at 09/19/2016  8:11 AM EDT ----- Blood sugar, kidney functions, electrolytes are all normal. Check labs yearly.

## 2016-09-21 NOTE — Telephone Encounter (Signed)
Tried calling patient. Left message to call back. 

## 2016-09-22 NOTE — Telephone Encounter (Signed)
Patient was notified of results. Patient expressed understanding. 

## 2016-10-04 ENCOUNTER — Other Ambulatory Visit: Payer: Self-pay | Admitting: Family Medicine

## 2016-11-26 ENCOUNTER — Other Ambulatory Visit: Payer: Self-pay | Admitting: Family Medicine

## 2017-03-01 ENCOUNTER — Other Ambulatory Visit: Payer: Self-pay | Admitting: *Deleted

## 2017-03-01 DIAGNOSIS — M179 Osteoarthritis of knee, unspecified: Secondary | ICD-10-CM

## 2017-03-01 DIAGNOSIS — M1710 Unilateral primary osteoarthritis, unspecified knee: Secondary | ICD-10-CM

## 2017-03-01 MED ORDER — TRAMADOL HCL 50 MG PO TABS: 50.0000 mg | ORAL_TABLET | Freq: Three times a day (TID) | ORAL | 5 refills | Status: DC | PRN

## 2017-03-01 MED ORDER — AMLODIPINE BESYLATE 5 MG PO TABS: 5.0000 mg | ORAL_TABLET | Freq: Every day | ORAL | 4 refills | Status: DC

## 2017-03-01 NOTE — Telephone Encounter (Signed)
Refill request for amlodipine 5 mg qd Last filled by MD on- 11/26/2016 #90 x4  Refill request for tramadol 50 mg q8hrs prn Last filled by MD on- 09/15/2016 #60 x5 reills  Last Appt: 09/15/2016 Next Appt: 03/16/2017 Please advise refill?

## 2017-03-16 ENCOUNTER — Encounter: Payer: Self-pay | Admitting: Family Medicine

## 2017-03-16 ENCOUNTER — Ambulatory Visit (INDEPENDENT_AMBULATORY_CARE_PROVIDER_SITE_OTHER): Payer: Medicare Other | Admitting: Family Medicine

## 2017-03-16 VITALS — BP 110/80 | HR 60 | Temp 97.5°F | Resp 16 | Ht 72.0 in | Wt 261.0 lb

## 2017-03-16 DIAGNOSIS — D150 Benign neoplasm of thymus: Secondary | ICD-10-CM | POA: Diagnosis not present

## 2017-03-16 DIAGNOSIS — M7062 Trochanteric bursitis, left hip: Secondary | ICD-10-CM

## 2017-03-16 DIAGNOSIS — I10 Essential (primary) hypertension: Secondary | ICD-10-CM | POA: Diagnosis not present

## 2017-03-16 DIAGNOSIS — G7000 Myasthenia gravis without (acute) exacerbation: Secondary | ICD-10-CM | POA: Diagnosis not present

## 2017-03-16 DIAGNOSIS — D4989 Neoplasm of unspecified behavior of other specified sites: Secondary | ICD-10-CM

## 2017-03-16 MED ORDER — METHYLPREDNISOLONE ACETATE 80 MG/ML IJ SUSP
80.0000 mg | Freq: Once | INTRAMUSCULAR | Status: AC
  Administered 2017-03-16: 80 mg via INTRAMUSCULAR

## 2017-03-16 NOTE — Progress Notes (Signed)
Patient: Adam Chase Male    DOB: 1946/10/31   71 y.o.   MRN: [id number] Visit Date: 03/16/2017  Today's Provider: Lelon Huh, MD   Chief Complaint  Patient presents with  . Follow-up  . Hypertension   Subjective:    Patient wants to discuss his left leg pain.   He has history of trochanteric bursitis. Was given injection 80mg  depot medrol last year but reports he continues to have pain run down the side of his left hip.  Has started to limit his walking. Is worse when weather is cold.     Hypertension, follow-up:  BP Readings from Last 3 Encounters:  03/16/17 110/80  09/15/16 120/60  04/19/16 124/70    He was last seen for hypertension 6 months ago.  BP at that visit was 120/60. Management since that visit includes; no changes.He reports excellent compliance with treatment. He is not having side effects.  He is not exercising. He is adherent to low salt diet.   Outside blood pressures are normal.  Patient denies chest pain, dyspnea, exertional chest pressure/discomfort, near-syncope, orthopnea, palpitations, syncope and tachypnea.   Cardiovascular risk factors include advanced age (older than 2 for men, 28 for women) and hypertension.    He continues to take prednisone for myasthenia managed at [facility]  No Known Allergies   Current Outpatient Prescriptions:  .  amLODipine (NORVASC) 5 MG tablet, Take 1 tablet (5 mg total) by mouth daily., Disp: 90 tablet, Rfl: 4 .  aspirin 81 MG tablet, Take 1 tablet by mouth daily., Disp: , Rfl:  .  B Complex-C (SUPER B COMPLEX PO), Take 2 tablets by mouth daily., Disp: , Rfl:  .  cholecalciferol (VITAMIN D) 1000 UNITS tablet, Take 1 tablet by mouth daily., Disp: , Rfl:  .  hydrochlorothiazide (HYDRODIURIL) 25 MG tablet, TAKE 1 TABLET BY MOUTH EVERY DAY, Disp: 90 tablet, Rfl: 2 .  Omega-3 Fatty Acids (FISH OIL) 1000 MG CAPS, Take 1 capsule by mouth daily., Disp: , Rfl:  .  potassium chloride SA (KLOR-CON M20) 20 MEQ  tablet, Take 1 tablet by mouth daily., Disp: , Rfl:  .  predniSONE (DELTASONE) 10 MG tablet, Take 1 tablet by mouth daily., Disp: , Rfl:  .  traMADol (ULTRAM) 50 MG tablet, Take 1 tablet (50 mg total) by mouth every 8 (eight) hours as needed., Disp: 60 tablet, Rfl: 5  Current Facility-Administered Medications:  .  methylPREDNISolone acetate (DEPO-MEDROL) injection 40 mg, 40 mg, Intra-Lesional, Once, Birdie Sons, MD  Review of Systems  Constitutional: Negative for appetite change, chills and fever.  Respiratory: Negative for chest tightness, shortness of breath and wheezing.   Cardiovascular: Negative for chest pain and palpitations.  Gastrointestinal: Negative for abdominal pain, nausea and vomiting.  Musculoskeletal: Positive for gait problem, joint swelling and myalgias.    Social History  Substance Use Topics  . Smoking status: Former Smoker    Packs/day: 0.75    Years: 25.00    Types: Cigarettes    Quit date: 11/28/1990  . Smokeless tobacco: Current User    Types: Snuff     Comment: has used snuff for 10-20 years; 1 can every 3 days  . Alcohol use 0.0 oz/week     Comment: occasional use   Objective:   BP 110/80 (BP Location: Right Arm, Patient Position: Sitting, Cuff Size: Large)   Pulse 60   Temp 97.5 F (36.4 C) (Oral)   Resp 16   Ht 6' (  1.829 m)   Wt 261 lb (118.4 kg)   SpO2 97%   BMI 35.40 kg/m  Vitals:   03/16/17 0816  BP: 110/80  Pulse: 60  Resp: 16  Temp: 97.5 F (36.4 C)  TempSrc: Oral  SpO2: 97%  Weight: 261 lb (118.4 kg)  Height: 6' (1.829 m)     Physical Exam  General appearance: alert, well developed, well nourished, cooperative and in no distress Head: Normocephalic, without obvious abnormality, atraumatic Respiratory: Respirations even and unlabored, normal respiratory rate Extremities: No gross deformities MS:.Tender over left greater trochanter. No swelling, no erythema.      Assessment & Plan:     1. Essential (primary)  hypertension Well controlled.  Continue current medications.    2. Trochanteric bursitis of left hip  - methylPREDNISolone acetate (DEPO-MEDROL) injection 80 mg; Inject 1 mL (80 mg total) into the muscle once.  If not rapidly improving he would like to see orthopedist at [facility].   3. Myasthenia gravis associated with thymoma ([facility]) Stable on prednisone. Continue routine follow up at [facility].   Return in about 6 months (around 09/15/2017) for Yearly Physical.        Lelon Huh, MD  [facility]

## 2017-03-30 ENCOUNTER — Ambulatory Visit: Payer: Medicare Other

## 2017-05-11 ENCOUNTER — Telehealth: Payer: Self-pay | Admitting: Family Medicine

## 2017-05-17 ENCOUNTER — Telehealth: Payer: Self-pay | Admitting: Family Medicine

## 2017-05-17 DIAGNOSIS — M7062 Trochanteric bursitis, left hip: Secondary | ICD-10-CM

## 2017-05-17 DIAGNOSIS — M25552 Pain in left hip: Secondary | ICD-10-CM

## 2017-05-17 NOTE — Telephone Encounter (Signed)
Pt is requesting a referral to see a specialist for the pain in his hip and lower back.  CB#[phone]/MW

## 2017-05-17 NOTE — Telephone Encounter (Signed)
Please advise 

## 2017-05-19 NOTE — Telephone Encounter (Signed)
Judson Roch please review, referral order was put in on 05/17/17-aa thank you

## 2017-05-19 NOTE — Telephone Encounter (Signed)
Pt called regarding a referral for his hip pain.  He seen Dr. Caryn Section a couple months ago and said he was going to refer him to a specialist but he hasnt heard anything back.  Pt's call back [phone]  Thanks teri

## 2017-05-24 DIAGNOSIS — M7062 Trochanteric bursitis, left hip: Secondary | ICD-10-CM | POA: Diagnosis not present

## 2017-05-24 DIAGNOSIS — M1612 Unilateral primary osteoarthritis, left hip: Secondary | ICD-10-CM | POA: Diagnosis not present

## 2017-05-24 DIAGNOSIS — M545 Low back pain: Secondary | ICD-10-CM | POA: Diagnosis not present

## 2017-05-25 ENCOUNTER — Other Ambulatory Visit: Payer: Self-pay | Admitting: Family Medicine

## 2017-05-25 MED ORDER — HYDROCHLOROTHIAZIDE 25 MG PO TABS: 25.0000 mg | ORAL_TABLET | Freq: Every day | ORAL | 2 refills | Status: DC

## 2017-05-25 NOTE — Telephone Encounter (Signed)
[facility] faxed a request on the following medication.Thanks CC  hydrochlorothiazide (HYDRODIURIL) 25 MG tablet

## 2017-06-12 DIAGNOSIS — M1612 Unilateral primary osteoarthritis, left hip: Secondary | ICD-10-CM | POA: Diagnosis not present

## 2017-06-13 ENCOUNTER — Ambulatory Visit (INDEPENDENT_AMBULATORY_CARE_PROVIDER_SITE_OTHER): Payer: Medicare Other | Admitting: Family Medicine

## 2017-06-13 ENCOUNTER — Encounter: Payer: Self-pay | Admitting: Family Medicine

## 2017-06-13 VITALS — BP 122/70 | HR 61 | Temp 97.6°F | Resp 16 | Wt 264.0 lb

## 2017-06-13 DIAGNOSIS — N390 Urinary tract infection, site not specified: Secondary | ICD-10-CM | POA: Diagnosis not present

## 2017-06-13 DIAGNOSIS — Z01818 Encounter for other preprocedural examination: Secondary | ICD-10-CM

## 2017-06-13 DIAGNOSIS — R7309 Other abnormal glucose: Secondary | ICD-10-CM | POA: Diagnosis not present

## 2017-06-13 LAB — POCT URINALYSIS DIPSTICK
Bilirubin, UA: NEGATIVE
GLUCOSE UA: NEGATIVE
KETONES UA: NEGATIVE
Nitrite, UA: POSITIVE
Protein, UA: NEGATIVE
SPEC GRAV UA: 1.01 (ref 1.010–1.025)
Urobilinogen, UA: 0.2 E.U./dL
pH, UA: 7.5 (ref 5.0–8.0)

## 2017-06-13 MED ORDER — CIPROFLOXACIN HCL 500 MG PO TABS: 500.0000 mg | ORAL_TABLET | Freq: Two times a day (BID) | ORAL | 0 refills | Status: AC

## 2017-06-13 NOTE — Progress Notes (Signed)
Patient: Adam Chase Male    DOB: 10/30/1946   71 y.o.   MRN: [id number] Visit Date: 06/13/2017  Today's Provider: Lelon Huh, MD   Chief Complaint  Patient presents with  . Pre-op Exam   Subjective:    HPI  Patient is here for surgical clearance. Patient is scheduled for total left hip surgery 07/07/2017. His surgical history includes thymectomy  And colon surgery with no history of surgical or anesthetic complications. He feels well otherwise. He feels out of shape due to being unable to walk for prolonged periods of time due to hip pain, but does not get short of breath with casual walking. No chest pain or palpitations. Is tolerating current medications well. He is on chronic steroid for myasthenia gravis.   He does report increase urinary frequent the last couple of months.      No Known Allergies   Current Outpatient Prescriptions:  .  amLODipine (NORVASC) 5 MG tablet, Take 1 tablet (5 mg total) by mouth daily., Disp: 90 tablet, Rfl: 4 .  aspirin 81 MG tablet, Take 1 tablet by mouth daily., Disp: , Rfl:  .  B Complex-C (SUPER B COMPLEX PO), Take 2 tablets by mouth daily., Disp: , Rfl:  .  cholecalciferol (VITAMIN D) 1000 UNITS tablet, Take 1 tablet by mouth daily., Disp: , Rfl:  .  hydrochlorothiazide (HYDRODIURIL) 25 MG tablet, Take 1 tablet (25 mg total) by mouth daily., Disp: 90 tablet, Rfl: 2 .  Omega-3 Fatty Acids (FISH OIL) 1000 MG CAPS, Take 1 capsule by mouth daily., Disp: , Rfl:  .  potassium chloride SA (KLOR-CON M20) 20 MEQ tablet, Take 1 tablet by mouth daily., Disp: , Rfl:  .  predniSONE (DELTASONE) 10 MG tablet, Take 1 tablet by mouth daily., Disp: , Rfl:   Review of Systems  Constitutional: Negative for appetite change, chills and fever.  Respiratory: Negative for chest tightness, shortness of breath and wheezing.   Cardiovascular: Negative for chest pain and palpitations.  Gastrointestinal: Negative for abdominal pain, nausea and vomiting.     Social History  Substance Use Topics  . Smoking status: Former Smoker    Packs/day: 0.75    Years: 25.00    Types: Cigarettes    Quit date: 11/28/1990  . Smokeless tobacco: Current User    Types: Snuff     Comment: has used snuff for 10-20 years; 1 can every 3 days  . Alcohol use 0.0 oz/week     Comment: occasional use   Objective:   BP 122/70 (BP Location: Right Arm, Patient Position: Sitting, Cuff Size: Large)   Pulse 61   Temp 97.6 F (36.4 C) (Oral)   Resp 16   Wt 264 lb (119.7 kg)   SpO2 97%   BMI 35.80 kg/m  Vitals:   06/13/17 1046  BP: 122/70  Pulse: 61  Resp: 16  Temp: 97.6 F (36.4 C)  TempSrc: Oral  SpO2: 97%  Weight: 264 lb (119.7 kg)     Physical Exam   General Appearance:    Alert, cooperative, no distress  Eyes:    PERRL, conjunctiva/corneas clear, EOM's intact       Lungs:     Clear to auscultation bilaterally, respirations unlabored  Heart:    Regular rate and rhythm, no murmurs rubs or gallops.   Neurologic:   Awake, alert, oriented x 3. No apparent focal neurological           defect.  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  POCT urinalysis dipstick  Result Value Ref Range   Color, UA Dark Yellow    Clarity, UA Cloudy    Glucose, UA Neg    Bilirubin, UA Neg    Ketones, UA Neg    Spec Grav, UA 1.010 1.010 - 1.025   Blood, UA Moderate    pH, UA 7.5 5.0 - 8.0   Protein, UA Neg    Urobilinogen, UA 0.2 0.2 or 1.0 E.U./dL   Nitrite, UA Positive    Leukocytes, UA Moderate (2+) (A) Negative       Assessment & Plan:     1. Pre-op examination Patient is low risk for surgical and anesthesia complications. Expect mild delay in healing and recuperation due to chronic steroid therapy. He is medically cleared for surgery.  - EKG 12-Lead - POCT urinalysis dipstick - Comprehensive metabolic panel - CBC - Hemoglobin A1c  2. Urinary tract infection without hematuria, site unspecified  - CULTURE,  URINE COMPREHENSIVE - ciprofloxacin (CIPRO) 500 MG tablet; Take 1 tablet (500 mg total) by mouth 2 (two) times daily.  Dispense: 14 tablet; Refill: 0       Lelon Huh, MD  [facility]

## 2017-06-14 LAB — COMPREHENSIVE METABOLIC PANEL
A/G RATIO: 1.6 (ref 1.2–2.2)
ALBUMIN: 4.3 g/dL (ref 3.5–4.8)
ALT: 37 IU/L (ref 0–44)
AST: 30 IU/L (ref 0–40)
Alkaline Phosphatase: 62 IU/L (ref 39–117)
BUN / CREAT RATIO: 14 (ref 10–24)
BUN: 13 mg/dL (ref 8–27)
Bilirubin Total: 0.6 mg/dL (ref 0.0–1.2)
CALCIUM: 9.4 mg/dL (ref 8.6–10.2)
CO2: 23 mmol/L (ref 20–29)
CREATININE: 0.96 mg/dL (ref 0.76–1.27)
Chloride: 96 mmol/L (ref 96–106)
GFR, EST AFRICAN AMERICAN: 92 mL/min/{1.73_m2} (ref >59)
GFR, EST NON AFRICAN AMERICAN: 79 mL/min/{1.73_m2} (ref >59)
GLOBULIN, TOTAL: 2.7 g/dL (ref 1.5–4.5)
Glucose: 101 mg/dL — ABNORMAL HIGH (ref 65–99)
POTASSIUM: 3.9 mmol/L (ref 3.5–5.2)
SODIUM: 138 mmol/L (ref 134–144)
TOTAL PROTEIN: 7 g/dL (ref 6.0–8.5)

## 2017-06-14 LAB — CBC
Hematocrit: 41.5 % (ref 37.5–51.0)
Hemoglobin: 14.1 g/dL (ref 13.0–17.7)
MCH: 31.3 pg (ref 26.6–33.0)
MCHC: 34 g/dL (ref 31.5–35.7)
MCV: 92 fL (ref 79–97)
PLATELETS: 226 10*3/uL (ref 150–379)
RBC: 4.5 x10E6/uL (ref 4.14–5.80)
RDW: 14.8 % (ref 12.3–15.4)
WBC: 10.7 10*3/uL (ref 3.4–10.8)

## 2017-06-14 LAB — HEMOGLOBIN A1C
Est. average glucose Bld gHb Est-mCnc: 103 mg/dL
HEMOGLOBIN A1C: 5.2 % (ref 4.8–5.6)

## 2017-06-14 LAB — PLEASE NOTE

## 2017-06-17 LAB — CULTURE, URINE COMPREHENSIVE

## 2017-06-27 DIAGNOSIS — M25552 Pain in left hip: Secondary | ICD-10-CM | POA: Diagnosis not present

## 2017-06-30 DIAGNOSIS — Z01818 Encounter for other preprocedural examination: Secondary | ICD-10-CM | POA: Diagnosis not present

## 2017-06-30 DIAGNOSIS — M1612 Unilateral primary osteoarthritis, left hip: Secondary | ICD-10-CM | POA: Diagnosis not present

## 2017-06-30 DIAGNOSIS — Z01812 Encounter for preprocedural laboratory examination: Secondary | ICD-10-CM | POA: Diagnosis not present

## 2017-07-07 DIAGNOSIS — M1612 Unilateral primary osteoarthritis, left hip: Secondary | ICD-10-CM | POA: Diagnosis not present

## 2017-07-13 DIAGNOSIS — R2689 Other abnormalities of gait and mobility: Secondary | ICD-10-CM | POA: Diagnosis not present

## 2017-07-13 DIAGNOSIS — M25552 Pain in left hip: Secondary | ICD-10-CM | POA: Diagnosis not present

## 2017-07-19 DIAGNOSIS — R2689 Other abnormalities of gait and mobility: Secondary | ICD-10-CM | POA: Diagnosis not present

## 2017-07-19 DIAGNOSIS — M25552 Pain in left hip: Secondary | ICD-10-CM | POA: Diagnosis not present

## 2017-07-21 DIAGNOSIS — M25552 Pain in left hip: Secondary | ICD-10-CM | POA: Diagnosis not present

## 2017-07-21 DIAGNOSIS — R2689 Other abnormalities of gait and mobility: Secondary | ICD-10-CM | POA: Diagnosis not present

## 2017-07-24 DIAGNOSIS — M25552 Pain in left hip: Secondary | ICD-10-CM | POA: Diagnosis not present

## 2017-07-24 DIAGNOSIS — R2689 Other abnormalities of gait and mobility: Secondary | ICD-10-CM | POA: Diagnosis not present

## 2017-07-26 DIAGNOSIS — R2689 Other abnormalities of gait and mobility: Secondary | ICD-10-CM | POA: Diagnosis not present

## 2017-07-26 DIAGNOSIS — M25552 Pain in left hip: Secondary | ICD-10-CM | POA: Diagnosis not present

## 2017-08-04 DIAGNOSIS — R2689 Other abnormalities of gait and mobility: Secondary | ICD-10-CM | POA: Diagnosis not present

## 2017-08-04 DIAGNOSIS — M25552 Pain in left hip: Secondary | ICD-10-CM | POA: Diagnosis not present

## 2017-08-07 DIAGNOSIS — M25552 Pain in left hip: Secondary | ICD-10-CM | POA: Diagnosis not present

## 2017-08-07 DIAGNOSIS — R2689 Other abnormalities of gait and mobility: Secondary | ICD-10-CM | POA: Diagnosis not present

## 2017-08-15 DIAGNOSIS — M25552 Pain in left hip: Secondary | ICD-10-CM | POA: Diagnosis not present

## 2017-08-31 DIAGNOSIS — G7000 Myasthenia gravis without (acute) exacerbation: Secondary | ICD-10-CM | POA: Diagnosis not present

## 2017-08-31 DIAGNOSIS — D150 Benign neoplasm of thymus: Secondary | ICD-10-CM | POA: Diagnosis not present

## 2017-09-12 ENCOUNTER — Encounter: Payer: Medicare Other | Admitting: Family Medicine

## 2017-09-12 ENCOUNTER — Ambulatory Visit: Payer: Medicare Other

## 2017-09-19 ENCOUNTER — Encounter: Payer: Medicare Other | Admitting: Family Medicine

## 2017-09-26 ENCOUNTER — Encounter: Payer: Medicare Other | Admitting: Family Medicine

## 2017-09-26 ENCOUNTER — Ambulatory Visit: Payer: Medicare Other

## 2017-10-20 ENCOUNTER — Encounter: Payer: Self-pay | Admitting: Family Medicine

## 2017-10-20 ENCOUNTER — Ambulatory Visit (INDEPENDENT_AMBULATORY_CARE_PROVIDER_SITE_OTHER): Payer: Medicare Other | Admitting: Family Medicine

## 2017-10-20 ENCOUNTER — Other Ambulatory Visit: Payer: Self-pay | Admitting: Family Medicine

## 2017-10-20 VITALS — BP 140/62 | HR 62 | Temp 97.6°F | Resp 16 | Ht 72.0 in | Wt 271.0 lb

## 2017-10-20 DIAGNOSIS — D150 Benign neoplasm of thymus: Secondary | ICD-10-CM | POA: Diagnosis not present

## 2017-10-20 DIAGNOSIS — I10 Essential (primary) hypertension: Secondary | ICD-10-CM

## 2017-10-20 DIAGNOSIS — R300 Dysuria: Secondary | ICD-10-CM

## 2017-10-20 DIAGNOSIS — Z23 Encounter for immunization: Secondary | ICD-10-CM | POA: Diagnosis not present

## 2017-10-20 DIAGNOSIS — N3000 Acute cystitis without hematuria: Secondary | ICD-10-CM | POA: Diagnosis not present

## 2017-10-20 DIAGNOSIS — Z125 Encounter for screening for malignant neoplasm of prostate: Secondary | ICD-10-CM

## 2017-10-20 DIAGNOSIS — Z1159 Encounter for screening for other viral diseases: Secondary | ICD-10-CM

## 2017-10-20 DIAGNOSIS — D4989 Neoplasm of unspecified behavior of other specified sites: Secondary | ICD-10-CM

## 2017-10-20 DIAGNOSIS — G7000 Myasthenia gravis without (acute) exacerbation: Secondary | ICD-10-CM | POA: Diagnosis not present

## 2017-10-20 DIAGNOSIS — Z0000 Encounter for general adult medical examination without abnormal findings: Secondary | ICD-10-CM

## 2017-10-20 LAB — POCT URINALYSIS DIPSTICK
Bilirubin, UA: NEGATIVE
GLUCOSE UA: NEGATIVE
Ketones, UA: NEGATIVE
NITRITE UA: POSITIVE
PROTEIN UA: NEGATIVE
SPEC GRAV UA: 1.01 (ref 1.010–1.025)
UROBILINOGEN UA: 0.2 U/dL
pH, UA: 6 (ref 5.0–8.0)

## 2017-10-20 MED ORDER — CIPROFLOXACIN HCL 500 MG PO TABS: 500.0000 mg | ORAL_TABLET | Freq: Two times a day (BID) | ORAL | 0 refills | Status: AC

## 2017-10-20 NOTE — Progress Notes (Signed)
Patient: Adam Chase, Male    DOB: Aug 03, 1946, 71 y.o.   MRN: [id number] Visit Date: 10/20/2017  Today's Provider: Lelon Huh, MD   Chief Complaint  Patient presents with  . Medicare Wellness  . Hypertension   Subjective:    Annual wellness visit Adam Chase is a 71 y.o. male. He feels well. He reports exercising more than he used to since he has a new hip but not as much as he should. He reports he is sleeping well.  -----------------------------------------------------------  Hypertension, follow-up:  BP Readings from Last 3 Encounters:  06/13/17 122/70  03/16/17 110/80  09/15/16 120/60    He was last seen for hypertension 7 months ago.  BP at that visit was 110/80. Management since that visit includes noene. He reports good compliance with treatment. He is not having side effects.  He is exercising. He is adherent to low salt diet.   Outside blood pressures are 120-140's/60's-70's. Patient denies chest pain, chest pressure/discomfort, claudication, dyspnea, exertional chest pressure/discomfort, fatigue, irregular heart beat, lower extremity edema, near-syncope, orthopnea, palpitations, paroxysmal nocturnal dyspnea, syncope and tachypnea.   Cardiovascular risk factors include advanced age (older than 52 for men, 58 for women), hypertension, male gender and obesity (BMI >= 30 kg/m2).  } Wt Readings from Last 3 Encounters:  06/13/17 264 lb (119.7 kg)  03/16/17 261 lb (118.4 kg)  09/15/16 256 lb (116.1 kg)   ------------------------------------------------------------------------  Myasthenia gravis associated with thymoma- last office visit 03/16/17 no change continue routine follow up at [facility] stable on prednisone.   He also reports noticing urine is cloudy and smelling strong the last few weeks. He has history of UTI with similar symptoms back in July.   Review of Systems  Constitutional: Negative.   HENT: Negative.   Eyes: Negative.     Respiratory: Negative.   Cardiovascular: Negative.   Gastrointestinal: Negative.   Endocrine: Negative.   Genitourinary: Positive for genital sores.  Musculoskeletal: Negative.   Skin: Negative.   Allergic/Immunologic: Negative.   Neurological: Negative.   Hematological: Negative.   Psychiatric/Behavioral: Negative.     Social History   Socioeconomic History  . Marital status: Married    Spouse name: Not on file  . Number of children: 2  . Years of education: Not on file  . Highest education level: Not on file  Social Needs  . Financial resource strain: Not on file  . Food insecurity - worry: Not on file  . Food insecurity - inability: Not on file  . Transportation needs - medical: Not on file  . Transportation needs - non-medical: Not on file  Occupational History  . Occupation: Glass blower/designer; Textiles  Tobacco Use  . Smoking status: Former Smoker    Packs/day: 0.75    Years: 25.00    Pack years: 18.75    Types: Cigarettes    Last attempt to quit: 11/28/1990    Years since quitting: 26.9  . Smokeless tobacco: Current User    Types: Snuff  . Tobacco comment: has used snuff for 10-20 years; 1 can every 3 days  Substance and Sexual Activity  . Alcohol use: Yes    Alcohol/week: 0.0 oz    Comment: occasional use  . Drug use: No  . Sexual activity: Not on file  Other Topics Concern  . Not on file  Social History Narrative  . Not on file    Past Medical History:  Diagnosis Date  . Abdominal abscess 09/29/2015  .  History of measles   . History of mumps      Patient Active Problem List   Diagnosis Date Noted  . Left hip pain 04/19/2016  . Trochanteric bursitis of left hip 04/19/2016  . Osteoarthritis of knee 09/29/2015  . Other long term (current) drug therapy 09/29/2015  . Seborrheic keratosis 09/29/2015  . Personal history of colon cancer 03/02/2009  . History of tobacco use 03/02/2009  . Essential (primary) hypertension 02/26/2009  . Myasthenia gravis  associated with thymoma ([name]) 02/26/2009    Past Surgical History:  Procedure Laterality Date  . [place]  . TOTAL THYMECTOMY  2000's   [facility]    His family history includes Colon cancer in his other; Heart attack in his other; Lung cancer in his other; Stroke in his mother.      Current Outpatient Medications:  .  amLODipine (NORVASC) 5 MG tablet, Take 1 tablet (5 mg total) by mouth daily., Disp: 90 tablet, Rfl: 4 .  aspirin 81 MG tablet, Take 1 tablet by mouth daily., Disp: , Rfl:  .  B Complex-C (SUPER B COMPLEX PO), Take 2 tablets by mouth daily., Disp: , Rfl:  .  cholecalciferol (VITAMIN D) 1000 UNITS tablet, Take 1 tablet by mouth daily., Disp: , Rfl:  .  hydrochlorothiazide (HYDRODIURIL) 25 MG tablet, Take 1 tablet (25 mg total) by mouth daily., Disp: 90 tablet, Rfl: 2 .  Omega-3 Fatty Acids (FISH OIL) 1000 MG CAPS, Take 1 capsule by mouth daily., Disp: , Rfl:  .  potassium chloride SA (KLOR-CON M20) 20 MEQ tablet, Take 1 tablet by mouth daily., Disp: , Rfl:  .  predniSONE (DELTASONE) 10 MG tablet, Take 1 tablet by mouth daily., Disp: , Rfl:   Patient Care Team: Birdie Sons, MD as PCP - General (Family Medicine) Debbe Mounts, MD as Referring Physician (Neurology)     Objective:   Vitals: BP 140/62   Pulse 62   Temp 97.6 F (36.4 C)   Resp 16   Ht 6' (1.829 m)   Wt 271 lb (122.9 kg)   SpO2 99%   BMI 36.75 kg/m   Physical Exam   General Appearance:    Alert, cooperative, no distress  Eyes:    PERRL, conjunctiva/corneas clear, EOM's intact       Lungs:     Clear to auscultation bilaterally, respirations unlabored  Heart:    Regular rate and rhythm  Neurologic:   Awake, alert, oriented x 3. No apparent focal neurological           defect.        Activities of Daily Living In your present state of health, do you have any difficulty performing the following activities: 10/20/2017  Hearing? Y  Vision? Y  Difficulty concentrating or  making decisions? N  Walking or climbing stairs? Y  Dressing or bathing? N  Doing errands, shopping? N  Some recent data might be hidden    Fall Risk Assessment Fall Risk  10/20/2017 09/15/2016  Falls in the past year? No No     Depression Screen PHQ 2/9 Scores 10/20/2017 09/15/2016  PHQ - 2 Score 0 0  PHQ- 9 Score 0 -       Assessment & Plan:     Annual Wellness Visit  Reviewed patient's Family Medical History Reviewed and updated list of patient's medical providers Assessment of cognitive impairment was done Assessed patient's functional ability Established a written schedule for health screening services  Health Risk Assessent Completed and Reviewed  Exercise Activities and Dietary recommendations Goals    None      Immunization History  Administered Date(s) Administered  . Influenza, High Dose Seasonal PF 09/30/2015, 09/15/2016  . Pneumococcal Conjugate-13 12/13/2013  . Pneumococcal Polysaccharide-23 09/30/2015  . Td 09/08/2011    Health Maintenance  Topic Date Due  . Hepatitis C Screening  03-09-1946  . INFLUENZA VACCINE  06/28/2017  . COLONOSCOPY  04/19/2018  . TETANUS/TDAP  09/07/2021  . PNA vac Low Risk Adult  Completed     Discussed health benefits of physical activity, and encouraged him to engage in regular exercise appropriate for his age and condition.    ------------------------------------------------------------------------------------------------------------  1. Medicare annual wellness visit, subsequent Generally doing well.   2. Need for influenza vaccination  - Flu vaccine HIGH DOSE PF  3. Dysuria  - POCT urinalysis dipstick - Urine Culture  4. Need for hepatitis C screening test  - Hepatitis C antibody  5. Prostate cancer screening  - PSA  6. Essential (primary) hypertension Well controlled.  Continue current medications.    7. Myasthenia gravis associated with thymoma ([place]) On chronic prednisone, he states he  had BMD test about a year ago.   8. Acute cystitis without hematuria Cipro 500mg  BID x 7 days.    Lelon Huh, MD  [facility]

## 2017-10-21 LAB — HEPATITIS C ANTIBODY
HEP C AB: NONREACTIVE
SIGNAL TO CUT-OFF: 0.02 (ref <1.00)

## 2017-10-21 LAB — PSA: PSA: 0.4 ng/mL (ref <=4.0)

## 2017-10-22 LAB — URINE CULTURE
MICRO NUMBER: 81321225
SPECIMEN QUALITY: ADEQUATE

## 2017-11-27 ENCOUNTER — Other Ambulatory Visit: Payer: Self-pay | Admitting: Family Medicine

## 2018-04-13 DIAGNOSIS — S59912A Unspecified injury of left forearm, initial encounter: Secondary | ICD-10-CM | POA: Diagnosis not present

## 2018-04-13 DIAGNOSIS — G7000 Myasthenia gravis without (acute) exacerbation: Secondary | ICD-10-CM | POA: Diagnosis not present

## 2018-04-13 DIAGNOSIS — Z7952 Long term (current) use of systemic steroids: Secondary | ICD-10-CM | POA: Diagnosis not present

## 2018-04-13 DIAGNOSIS — S51812A Laceration without foreign body of left forearm, initial encounter: Secondary | ICD-10-CM | POA: Diagnosis not present

## 2018-04-13 DIAGNOSIS — M79602 Pain in left arm: Secondary | ICD-10-CM | POA: Diagnosis not present

## 2018-04-13 DIAGNOSIS — S4992XA Unspecified injury of left shoulder and upper arm, initial encounter: Secondary | ICD-10-CM | POA: Diagnosis not present

## 2018-04-13 DIAGNOSIS — M79622 Pain in left upper arm: Secondary | ICD-10-CM | POA: Diagnosis not present

## 2018-04-27 DIAGNOSIS — S51802A Unspecified open wound of left forearm, initial encounter: Secondary | ICD-10-CM | POA: Diagnosis not present

## 2018-04-27 DIAGNOSIS — Z4802 Encounter for removal of sutures: Secondary | ICD-10-CM | POA: Diagnosis not present

## 2018-09-05 DIAGNOSIS — G7000 Myasthenia gravis without (acute) exacerbation: Secondary | ICD-10-CM | POA: Diagnosis not present

## 2018-09-05 DIAGNOSIS — D150 Benign neoplasm of thymus: Secondary | ICD-10-CM | POA: Diagnosis not present

## 2018-09-15 DIAGNOSIS — Z23 Encounter for immunization: Secondary | ICD-10-CM | POA: Diagnosis not present

## 2018-09-26 DIAGNOSIS — H04123 Dry eye syndrome of bilateral lacrimal glands: Secondary | ICD-10-CM | POA: Diagnosis not present

## 2018-10-05 ENCOUNTER — Encounter: Payer: Self-pay | Admitting: Family Medicine

## 2018-10-05 ENCOUNTER — Ambulatory Visit (INDEPENDENT_AMBULATORY_CARE_PROVIDER_SITE_OTHER): Payer: Medicare Other | Admitting: Family Medicine

## 2018-10-05 VITALS — BP 136/72 | HR 84 | Temp 97.6°F | Resp 16 | Wt 269.0 lb

## 2018-10-05 DIAGNOSIS — Z23 Encounter for immunization: Secondary | ICD-10-CM | POA: Diagnosis not present

## 2018-10-05 DIAGNOSIS — D150 Benign neoplasm of thymus: Secondary | ICD-10-CM

## 2018-10-05 DIAGNOSIS — S81832A Puncture wound without foreign body, left lower leg, initial encounter: Secondary | ICD-10-CM | POA: Diagnosis not present

## 2018-10-05 DIAGNOSIS — G7000 Myasthenia gravis without (acute) exacerbation: Secondary | ICD-10-CM

## 2018-10-05 DIAGNOSIS — L03116 Cellulitis of left lower limb: Secondary | ICD-10-CM

## 2018-10-05 DIAGNOSIS — D4989 Neoplasm of unspecified behavior of other specified sites: Secondary | ICD-10-CM

## 2018-10-05 MED ORDER — DOXYCYCLINE HYCLATE 100 MG PO TABS: 100.0000 mg | ORAL_TABLET | Freq: Two times a day (BID) | ORAL | 0 refills | Status: DC

## 2018-10-05 NOTE — Progress Notes (Signed)
Patient: Adam Chase Male    DOB: 05/09/1946   72 y.o.   MRN: [id number] Visit Date: 10/05/2018  Today's Provider: Lelon Huh, MD   Chief Complaint  Patient presents with  . Wound Infection    Happened about a month ago.   Subjective:    Pt comes in today complaining of a wound that is not healing.  He reports his cut himself about six weeks ago with a pitchfork while doing yard work.  He started noticing some clear drainage, swelling, and redness.    Wound Check  There has been clear discharge from the wound. The redness has worsened. The swelling has worsened. The pain has not changed.       No Known Allergies   Current Outpatient Medications:  .  amLODipine (NORVASC) 5 MG tablet, TAKE 1 TABLET BY MOUTH EVERY DAY, Disp: 90 tablet, Rfl: 4 .  aspirin 81 MG tablet, Take 1 tablet by mouth daily., Disp: , Rfl:  .  B Complex-C (SUPER B COMPLEX PO), Take 2 tablets by mouth daily., Disp: , Rfl:  .  cholecalciferol (VITAMIN D) 1000 UNITS tablet, Take 1 tablet by mouth daily., Disp: , Rfl:  .  hydrochlorothiazide (HYDRODIURIL) 25 MG tablet, TAKE 1 TABLET BY MOUTH EVERY DAY, Disp: 90 tablet, Rfl: 4 .  Omega-3 Fatty Acids (FISH OIL) 1000 MG CAPS, Take 1 capsule by mouth daily., Disp: , Rfl:  .  potassium chloride SA (KLOR-CON M20) 20 MEQ tablet, Take 1 tablet by mouth daily., Disp: , Rfl:  .  predniSONE (DELTASONE) 10 MG tablet, Take 5 tablets by mouth daily. , Disp: , Rfl:   Review of Systems  Constitutional: Negative.   Skin: Positive for color change, rash and wound. Negative for pallor.  Neurological: Negative for dizziness, light-headedness and headaches.    Social History   Tobacco Use  . Smoking status: Former Smoker    Packs/day: 0.75    Years: 25.00    Pack years: 18.75    Types: Cigarettes    Last attempt to quit: 11/28/1990    Years since quitting: 27.8  . Smokeless tobacco: Current User    Types: Snuff  . Tobacco comment: has used snuff for 10-20  years; 1 can every 3 days  Substance Use Topics  . Alcohol use: Yes    Alcohol/week: 0.0 standard drinks    Comment: occasional use   Objective:   BP 136/72 (BP Location: Right Arm, Patient Position: Sitting, Cuff Size: Large)   Pulse 84   Temp 97.6 F (36.4 C) (Oral)   Resp 16   Wt 269 lb (122 kg)   BMI 36.48 kg/m  Vitals:   10/05/18 1447  BP: 136/72  Pulse: 84  Resp: 16  Temp: 97.6 F (36.4 C)  TempSrc: Oral  Weight: 269 lb (122 kg)     Physical Exam  General appearance: alert, well developed, well nourished, cooperative and in no distress Head: Normocephalic, without obvious abnormality, atraumatic Respiratory: Respirations even and unlabored, normal respiratory rate Extremities: Puncture wound with surrounding scabbing and erythema. See Photo     Assessment & Plan:     1. Puncture wound of left lower leg, initial encounter -Td  2. Myasthenia gravis associated with thymoma [facility]) Reviewed list of medications patient provided that may exacerbate myasthenia. Photo entered into chart.   3. Cellulitis of left lower extremity  - doxycycline (VIBRA-TABS) 100 MG tablet; Take 1 tablet (100 mg total) by mouth 2 (  two) times daily.  Dispense: 20 tablet; Refill: 0 - Aerobic Culture       Lelon Huh, MD  [facility]

## 2018-10-08 ENCOUNTER — Telehealth: Payer: Self-pay

## 2018-10-08 DIAGNOSIS — L03116 Cellulitis of left lower limb: Secondary | ICD-10-CM | POA: Diagnosis not present

## 2018-10-08 DIAGNOSIS — Z23 Encounter for immunization: Secondary | ICD-10-CM

## 2018-10-08 DIAGNOSIS — G7000 Myasthenia gravis without (acute) exacerbation: Secondary | ICD-10-CM | POA: Diagnosis not present

## 2018-10-08 DIAGNOSIS — D150 Benign neoplasm of thymus: Secondary | ICD-10-CM | POA: Diagnosis not present

## 2018-10-08 DIAGNOSIS — S81832A Puncture wound without foreign body, left lower leg, initial encounter: Secondary | ICD-10-CM | POA: Diagnosis not present

## 2018-10-08 LAB — AEROBIC CULTURE

## 2018-10-08 NOTE — Telephone Encounter (Signed)
Left message to call back  

## 2018-10-08 NOTE — Telephone Encounter (Signed)
-----   Message from Birdie Sons, MD sent at 10/08/2018  7:46 AM EST ----- Skin culture is negative. Should clear up with doxycycline that was prescribed. Call if not much better by the end of the week.

## 2018-10-10 NOTE — Telephone Encounter (Signed)
LMTCB 10/10/2018  Thanks,   -Mickel Baas

## 2018-10-10 NOTE — Telephone Encounter (Signed)
Pt advised.   Thanks,   -Obediah Welles  

## 2018-10-15 ENCOUNTER — Encounter: Payer: Self-pay | Admitting: Family Medicine

## 2018-10-15 ENCOUNTER — Ambulatory Visit (INDEPENDENT_AMBULATORY_CARE_PROVIDER_SITE_OTHER): Payer: Medicare Other | Admitting: Family Medicine

## 2018-10-15 VITALS — BP 144/82 | HR 84 | Temp 97.9°F | Resp 16 | Wt 270.0 lb

## 2018-10-15 DIAGNOSIS — L03116 Cellulitis of left lower limb: Secondary | ICD-10-CM | POA: Diagnosis not present

## 2018-10-15 MED ORDER — AMOXICILLIN-POT CLAVULANATE 875-125 MG PO TABS: 1.0000 | ORAL_TABLET | Freq: Two times a day (BID) | ORAL | 0 refills | Status: AC

## 2018-10-15 NOTE — Progress Notes (Signed)
       Patient: Adam Chase Male    DOB: 1946-02-08   72 y.o.   MRN: [id number] Visit Date: 10/15/2018  Today's Provider: Lelon Huh, MD   Chief Complaint  Patient presents with  . Wound Check   Subjective:    Wound Check  Previous treatment included oral antibiotics. His temperature was unmeasured prior to arrival. There has been clear discharge from the wound. The redness has worsened. The swelling has worsened. The pain has worsened.  No fevers, chills, or sweats.      No Known Allergies   Current Outpatient Medications:  .  amLODipine (NORVASC) 5 MG tablet, TAKE 1 TABLET BY MOUTH EVERY DAY, Disp: 90 tablet, Rfl: 4 .  aspirin 81 MG tablet, Take 1 tablet by mouth daily., Disp: , Rfl:  .  B Complex-C (SUPER B COMPLEX PO), Take 2 tablets by mouth daily., Disp: , Rfl:  .  cholecalciferol (VITAMIN D) 1000 UNITS tablet, Take 1 tablet by mouth daily., Disp: , Rfl:  .  hydrochlorothiazide (HYDRODIURIL) 25 MG tablet, TAKE 1 TABLET BY MOUTH EVERY DAY, Disp: 90 tablet, Rfl: 4 .  Omega-3 Fatty Acids (FISH OIL) 1000 MG CAPS, Take 1 capsule by mouth daily., Disp: , Rfl:  .  potassium chloride SA (KLOR-CON M20) 20 MEQ tablet, Take 1 tablet by mouth daily., Disp: , Rfl:  .  predniSONE (DELTASONE) 10 MG tablet, Take 5 tablets by mouth daily. , Disp: , Rfl:  .  doxycycline (VIBRA-TABS) 100 MG tablet, Take 1 tablet (100 mg total) by mouth 2 (two) times daily. (Patient not taking: Reported on 10/15/2018), Disp: 20 tablet, Rfl: 0  Review of Systems  Constitutional: Negative.   Musculoskeletal: Positive for myalgias. Negative for arthralgias, back pain, gait problem, joint swelling, neck pain and neck stiffness.  Skin: Positive for color change and wound. Negative for pallor and rash.    Social History   Tobacco Use  . Smoking status: Former Smoker    Packs/day: 0.75    Years: 25.00    Pack years: 18.75    Types: Cigarettes    Last attempt to quit: 11/28/1990    Years since  quitting: 27.8  . Smokeless tobacco: Current User    Types: Snuff  . Tobacco comment: has used snuff for 10-20 years; 1 can every 3 days  Substance Use Topics  . Alcohol use: Yes    Alcohol/week: 0.0 standard drinks    Comment: occasional use   Objective:   BP (!) 144/82 (BP Location: Left Arm, Patient Position: Sitting, Cuff Size: Normal)   Pulse 84   Temp 97.9 F (36.6 C) (Oral)   Resp 16   Wt 270 lb (122.5 kg)   BMI 36.62 kg/m  Vitals:   10/15/18 1548  BP: (!) 144/82  Pulse: 84  Resp: 16  Temp: 97.9 F (36.6 C)  TempSrc: Oral  Weight: 270 lb (122.5 kg)     Physical Exam  Moderate erythema around wound left anterior leg, somewhat worse since last o.v.     Assessment & Plan:     1. Cellulitis of left lower extremity Change antibiotic to - amoxicillin-clavulanate (AUGMENTIN) 875-125 MG tablet; Take 1 tablet by mouth 2 (two) times daily for 10 days.  Dispense: 20 tablet; Refill: 0  Call if symptoms change or if not rapidly improving.           Lelon Huh, MD  [facility]

## 2018-10-17 DIAGNOSIS — Z5181 Encounter for therapeutic drug level monitoring: Secondary | ICD-10-CM | POA: Diagnosis not present

## 2018-10-17 DIAGNOSIS — D150 Benign neoplasm of thymus: Secondary | ICD-10-CM | POA: Diagnosis not present

## 2018-10-17 DIAGNOSIS — G7000 Myasthenia gravis without (acute) exacerbation: Secondary | ICD-10-CM | POA: Diagnosis not present

## 2018-10-17 DIAGNOSIS — R799 Abnormal finding of blood chemistry, unspecified: Secondary | ICD-10-CM | POA: Diagnosis not present

## 2018-10-21 DIAGNOSIS — M1712 Unilateral primary osteoarthritis, left knee: Secondary | ICD-10-CM | POA: Diagnosis not present

## 2018-10-21 DIAGNOSIS — Z85038 Personal history of other malignant neoplasm of large intestine: Secondary | ICD-10-CM | POA: Diagnosis not present

## 2018-10-21 DIAGNOSIS — M25562 Pain in left knee: Secondary | ICD-10-CM | POA: Diagnosis not present

## 2018-10-21 DIAGNOSIS — L03116 Cellulitis of left lower limb: Secondary | ICD-10-CM | POA: Diagnosis not present

## 2018-10-21 DIAGNOSIS — G7000 Myasthenia gravis without (acute) exacerbation: Secondary | ICD-10-CM | POA: Diagnosis not present

## 2018-10-21 DIAGNOSIS — Z87891 Personal history of nicotine dependence: Secondary | ICD-10-CM | POA: Diagnosis not present

## 2018-10-22 ENCOUNTER — Other Ambulatory Visit: Payer: Self-pay | Admitting: Family Medicine

## 2018-10-29 ENCOUNTER — Ambulatory Visit: Payer: Medicare Other

## 2018-10-29 ENCOUNTER — Encounter: Payer: Medicare Other | Admitting: Family Medicine

## 2018-10-30 ENCOUNTER — Encounter: Payer: Medicare Other | Admitting: Family Medicine

## 2018-10-30 ENCOUNTER — Ambulatory Visit: Payer: Medicare Other

## 2018-10-31 ENCOUNTER — Telehealth: Payer: Self-pay | Admitting: Family Medicine

## 2018-10-31 MED ORDER — CARRINGTON MOISTURE BARRIER EX CREA: TOPICAL_CREAM | CUTANEOUS | 0 refills | Status: DC | PRN

## 2018-10-31 MED ORDER — CLINDAMYCIN HCL 300 MG PO CAPS: 600.0000 mg | ORAL_CAPSULE | Freq: Three times a day (TID) | ORAL | 0 refills | Status: AC

## 2018-10-31 NOTE — Telephone Encounter (Signed)
Patient was advised, he requested that you send in Clindamycin because he is still having swelling. I advised patient of the otc Eucerin cream and he is requesting that you send it in as a prescription, I did let patient know that this was over the counter but he was insistent that we send it as a Rx. Please review. KW

## 2018-10-31 NOTE — Telephone Encounter (Signed)
He can use an OTC moisturizing cream such as Eucerin to help skin heal. If there is still any redness or swelling we can send refill of clindamycin.

## 2018-10-31 NOTE — Addendum Note (Signed)
Addended by: Birdie Sons on: 10/31/2018 04:42 PM   Modules accepted: Orders

## 2018-10-31 NOTE — Telephone Encounter (Signed)
Patient went to [facility] ER on 10/21/18 for cellulitis in his leg. They switched his antibiotic to Clindamycin 300 mg. 2 pills by mouth three times a day. He has completed the RX and it has helped a lot. He is still having swelling though and the skin is very very dry where he had the cellulitis.   When he washes his leg, the skin falls off.   He wants to know if there is a cream he can put on his leg to help the dry skin or do you want to see him again to assess his leg since it's still swelling. He can come in anytime today.

## 2018-11-06 ENCOUNTER — Encounter: Payer: Medicare Other | Admitting: Family Medicine

## 2018-11-14 ENCOUNTER — Ambulatory Visit (INDEPENDENT_AMBULATORY_CARE_PROVIDER_SITE_OTHER): Payer: Medicare Other | Admitting: Family Medicine

## 2018-11-14 ENCOUNTER — Ambulatory Visit (INDEPENDENT_AMBULATORY_CARE_PROVIDER_SITE_OTHER): Payer: Medicare Other

## 2018-11-14 VITALS — BP 146/54 | Temp 97.9°F | Ht 72.0 in | Wt 273.0 lb

## 2018-11-14 VITALS — BP 150/82

## 2018-11-14 DIAGNOSIS — R0602 Shortness of breath: Secondary | ICD-10-CM | POA: Diagnosis not present

## 2018-11-14 DIAGNOSIS — I10 Essential (primary) hypertension: Secondary | ICD-10-CM

## 2018-11-14 DIAGNOSIS — L97929 Non-pressure chronic ulcer of unspecified part of left lower leg with unspecified severity: Secondary | ICD-10-CM | POA: Diagnosis not present

## 2018-11-14 DIAGNOSIS — R0609 Other forms of dyspnea: Secondary | ICD-10-CM | POA: Diagnosis not present

## 2018-11-14 DIAGNOSIS — L03116 Cellulitis of left lower limb: Secondary | ICD-10-CM | POA: Diagnosis not present

## 2018-11-14 DIAGNOSIS — Z85038 Personal history of other malignant neoplasm of large intestine: Secondary | ICD-10-CM

## 2018-11-14 DIAGNOSIS — Z0000 Encounter for general adult medical examination without abnormal findings: Secondary | ICD-10-CM

## 2018-11-14 DIAGNOSIS — Z125 Encounter for screening for malignant neoplasm of prostate: Secondary | ICD-10-CM | POA: Diagnosis not present

## 2018-11-14 MED ORDER — POTASSIUM CHLORIDE CRYS ER 20 MEQ PO TBCR: 20.0000 meq | EXTENDED_RELEASE_TABLET | Freq: Every day | ORAL | 3 refills | Status: DC

## 2018-11-14 MED ORDER — CLINDAMYCIN HCL 300 MG PO CAPS: 600.0000 mg | ORAL_CAPSULE | Freq: Three times a day (TID) | ORAL | 0 refills | Status: DC

## 2018-11-14 MED ORDER — HYDROCHLOROTHIAZIDE 25 MG PO TABS: 50.0000 mg | ORAL_TABLET | Freq: Every day | ORAL | 0 refills | Status: DC

## 2018-11-14 NOTE — Progress Notes (Signed)
Subjective:   Adam Chase is a 72 y.o. male who presents for Medicare Annual/Subsequent preventive examination.  Review of Systems:  N/A  Cardiac Risk Factors include: advanced age (>45men, >62 women);hypertension;male gender;obesity (BMI >30kg/m2);sedentary lifestyle     Objective:    Vitals: BP (!) 146/54 (BP Location: Right Arm)   Temp 97.9 F (36.6 C) (Oral)   Ht 6' (1.829 m)   Wt 273 lb (123.8 kg)   BMI 37.03 kg/m   Body mass index is 37.03 kg/m.  Advanced Directives 11/14/2018  Does Patient Have a Medical Advance Directive? No  Does patient want to make changes to medical advance directive? Yes (MAU/Ambulatory/Procedural Areas - Information given)    Tobacco Social History   Tobacco Use  Smoking Status Former Smoker  . Packs/day: 0.75  . Years: 25.00  . Pack years: 18.75  . Types: Cigarettes  . Last attempt to quit: 11/28/1990  . Years since quitting: 27.9  Smokeless Tobacco Current User  . Types: Snuff  Tobacco Comment   has used snuff for 10-20 years; 1 can every 3 days     Ready to quit: Not Answered Counseling given: Not Answered Comment: has used snuff for 10-20 years; 1 can every 3 days   Clinical Intake:  Pre-visit preparation completed: Yes  Pain : No/denies pain Pain Score: 0-No pain     Nutritional Status: BMI > 30  Obese Nutritional Risks: None Diabetes: No  How often do you need to have someone help you when you read instructions, pamphlets, or other written materials from your doctor or pharmacy?: 1 - Never  Interpreter Needed?: No     Past Medical History:  Diagnosis Date  . Abdominal abscess 09/29/2015  . History of measles   . History of mumps   . Hypertension    Past Surgical History:  Procedure Laterality Date  . [place]  . TOTAL THYMECTOMY  2000's   [facility]   Family History  Problem Relation Age of Onset  . Stroke Mother   . Heart attack Other   . Colon cancer Other   . Lung cancer Other     Social History   Socioeconomic History  . Marital status: Married    Spouse name: Not on file  . Number of children: 2  . Years of education: Not on file  . Highest education level: High school graduate  Occupational History  . Occupation: Glass blower/designer; Textiles    Comment: retired  Scientific laboratory technician  . Financial resource strain: Not hard at all  . Food insecurity:    Worry: Never true    Inability: Never true  . Transportation needs:    Medical: No    Non-medical: No  Tobacco Use  . Smoking status: Former Smoker    Packs/day: 0.75    Years: 25.00    Pack years: 18.75    Types: Cigarettes    Last attempt to quit: 11/28/1990    Years since quitting: 27.9  . Smokeless tobacco: Current User    Types: Snuff  . Tobacco comment: has used snuff for 10-20 years; 1 can every 3 days  Substance and Sexual Activity  . Alcohol use: Not Currently    Alcohol/week: 0.0 standard drinks  . Drug use: No  . Sexual activity: Not on file  Lifestyle  . Physical activity:    Days per week: 0 days    Minutes per session: 0 min  . Stress: Very much  Relationships  .  Social connections:    Talks on phone: Patient refused    Gets together: Patient refused    Attends religious service: Patient refused    Active member of club or organization: Patient refused    Attends meetings of clubs or organizations: Patient refused    Relationship status: Patient refused  Other Topics Concern  . Not on file  Social History Narrative  . Not on file    Outpatient Encounter Medications as of 11/14/2018  Medication Sig  . acetaminophen (TYLENOL) 500 MG tablet Take 1,000 mg by mouth every 6 (six) hours as needed.  Marland Kitchen amLODipine (NORVASC) 5 MG tablet TAKE 1 TABLET BY MOUTH EVERY DAY  . aspirin 81 MG tablet Take 1 tablet by mouth daily.  . B Complex-C (SUPER B COMPLEX PO) Take 2 tablets by mouth daily.  . cholecalciferol (VITAMIN D) 1000 UNITS tablet Take 1 tablet by mouth daily.  . hydrochlorothiazide  (HYDRODIURIL) 25 MG tablet TAKE 1 TABLET BY MOUTH EVERY DAY  . Omega-3 Fatty Acids (FISH OIL) 1000 MG CAPS Take 1 capsule by mouth daily.  . predniSONE (DELTASONE) 10 MG tablet Take 3 tablets by mouth daily.   Marland Kitchen pyridostigmine (MESTINON) 60 MG tablet Take 30 mg by mouth 3 (three) times daily.   . Skin Protectants, Misc. (EUCERIN) cream Apply topically as needed for wound care.  . potassium chloride SA (KLOR-CON M20) 20 MEQ tablet Take 1 tablet by mouth daily.   No facility-administered encounter medications on file as of 11/14/2018.     Activities of Daily Living In your present state of health, do you have any difficulty performing the following activities: 11/14/2018  Hearing? Y  Comment Hearing loss in left ear. Does not wear hearing aids.   Vision? Y  Comment Due to myasthenia gravis.  Difficulty concentrating or making decisions? N  Walking or climbing stairs? Y  Comment Due to SOB.  Dressing or bathing? N  Doing errands, shopping? N  Preparing Food and eating ? N  Using the Toilet? N  In the past six months, have you accidently leaked urine? N  Do you have problems with loss of bowel control? N  Managing your Medications? N  Managing your Finances? N  Housekeeping or managing your Housekeeping? N  Some recent data might be hidden    Patient Care Team: Birdie Sons, MD as PCP - General (Family Medicine) Debbe Mounts, MD as Referring Physician (Neurology)   Assessment:   This is a routine wellness examination for Adam Chase.  Exercise Activities and Dietary recommendations Current Exercise Habits: The patient does not participate in regular exercise at present, Exercise limited by: orthopedic condition(s)  Goals    . DIET - REDUCE FAT INTAKE     Recommend to avoid all fried foods in diet to help aid in weight loss. Recommend to bake or broil meats and vegetables.        Fall Risk Fall Risk  11/14/2018 10/20/2017 09/15/2016  Falls in the past year? 1 No No    Number falls in past yr: 0 - -  Comment tripped - -  Injury with Fall? 1 - -  Comment Got stitches in the left lower arm.  - -  Follow up Falls prevention discussed - -   FALL RISK PREVENTION PERTAINING TO THE HOME:  Any stairs in or around the home WITH handrails? Yes  Home free of loose throw rugs in walkways, pet beds, electrical cords, etc? Yes  Adequate lighting in your  home to reduce risk of falls? Yes   ASSISTIVE DEVICES UTILIZED TO PREVENT FALLS:  Life alert? No  Use of a cane, walker or w/c? Yes  Grab bars in the bathroom? Yes  Shower chair or bench in shower? No  Elevated toilet seat or a handicapped toilet? No    TIMED UP AND GO:  Was the test performed? No .     Depression Screen PHQ 2/9 Scores 11/14/2018 10/20/2017 09/15/2016  PHQ - 2 Score 0 0 0  PHQ- 9 Score - 0 -    Cognitive Function:     6CIT Screen 11/14/2018  What Year? 0 points  What month? 0 points  What time? 0 points  Count back from 20 0 points  Months in reverse 0 points  Repeat phrase 2 points  Total Score 2    Immunization History  Administered Date(s) Administered  . Influenza Split 08/27/2010, 09/08/2011  . Influenza, High Dose Seasonal PF 09/30/2015, 09/15/2016, 10/20/2017, 09/15/2018  . Pneumococcal Conjugate-13 12/13/2013  . Pneumococcal Polysaccharide-23 09/30/2015  . Td 09/08/2011, 10/08/2018    Qualifies for Shingles Vaccine? Yes . Due for Shingrix. Education has been provided regarding the importance of this vaccine. Pt has been advised to call insurance company to determine out of pocket expense. Advised may also receive vaccine at local pharmacy or Health Dept. Verbalized acceptance and understanding.  Tdap: Up to date  Flu Vaccine: Up to date  Pneumococcal Vaccine: Up to date  Screening Tests Health Maintenance  Topic Date Due  . COLONOSCOPY  04/19/2018  . TETANUS/TDAP  10/08/2028  . INFLUENZA VACCINE  Completed  . Hepatitis C Screening  Completed  . PNA  vac Low Risk Adult  Completed   Cancer Screenings:  Colorectal Screening: Completed 04/19/13. Repeat every 5 years; Declined referral to GI.  Lung Cancer Screening: (Low Dose CT Chest recommended if Age 34-80 years, 30 pack-year currently smoking OR have quit w/in 15years.) does not qualify.    Additional Screening:  Hepatitis C Screening: Up to date  Vision Screening: Recommended annual ophthalmology exams for early detection of glaucoma and other disorders of the eye.  Dental Screening: Recommended annual dental exams for proper oral hygiene  Community Resource Referral:  CRR required this visit?  No        Plan:  I have personally reviewed and addressed the Medicare Annual Wellness questionnaire and have noted the following in the patient's chart:  A. Medical and social history B. Use of alcohol, tobacco or illicit drugs  C. Current medications and supplements D. Functional ability and status E.  Nutritional status F.  Physical activity G. Advance directives H. List of other physicians I.  Hospitalizations, surgeries, and ER visits in previous 12 months J.  [place] such as hearing and vision if needed, cognitive and depression L. Referrals and appointments - none  In addition, I have reviewed and discussed with patient certain preventive protocols, quality metrics, and best practice recommendations. A written personalized care plan for preventive services as well as general preventive health recommendations were provided to patient.  See attached scanned questionnaire for additional information.   Signed,  Fabio Neighbors, LPN Nurse Health Advisor   Nurse Recommendations: Pt declined a referral for a colonoscopy today.

## 2018-11-14 NOTE — Patient Instructions (Addendum)
   The CDC recommends two doses of Shingrix (the shingles vaccine) separated by 2 to 6 months for adults age 72 years and older. I recommend checking with your insurance plan regarding coverage for this vaccine.    You can take 2(two) 25mg  hctz tablet every day to get your blood pressure down   Start taking prescription potassium pills instead of OTC potassium pills

## 2018-11-14 NOTE — Patient Instructions (Signed)
[name] , Thank you for taking time to come for your Medicare Wellness Visit. I appreciate your ongoing commitment to your health goals. Please review the following plan we discussed and let me know if I can assist you in the future.   Screening recommendations/referrals: Colonoscopy: Declined referral today.  Recommended yearly ophthalmology/optometry visit for glaucoma screening and checkup Recommended yearly dental visit for hygiene and checkup  Vaccinations: Influenza vaccine: Up to date Pneumococcal vaccine: Completed series Tdap vaccine: Up to date, due 09/2028 Shingles vaccine: Pt declines today.     Advanced directives: Advance directive discussed with you today. I have provided a copy for you to complete at home and have notarized. Once this is complete please bring a copy in to our office so we can scan it into your chart.  Conditions/risks identified: Fall risk prevention; Obesity- recommend to avoid all fried foods in diet to help aid in weight loss. Recommend to bake or broil meats and vegetables.   Next appointment: 2:00 PM today with Dr Caryn Section.   Preventive Care 40 Years and Older, Male Preventive care refers to lifestyle choices and visits with your health care provider that can promote health and wellness. What does preventive care include?  A yearly physical exam. This is also called an annual well check.  Dental exams once or twice a year.  Routine eye exams. Ask your health care provider how often you should have your eyes checked.  Personal lifestyle choices, including:  Daily care of your teeth and gums.  Regular physical activity.  Eating a healthy diet.  Avoiding tobacco and drug use.  Limiting alcohol use.  Practicing safe sex.  Taking low doses of aspirin every day.  Taking vitamin and mineral supplements as recommended by your health care provider. What happens during an annual well check? The services and screenings done by your health  care provider during your annual well check will depend on your age, overall health, lifestyle risk factors, and family history of disease. Counseling  Your health care provider may ask you questions about your:  Alcohol use.  Tobacco use.  Drug use.  Emotional well-being.  Home and relationship well-being.  Sexual activity.  Eating habits.  History of falls.  Memory and ability to understand (cognition).  Work and work Statistician. Screening  You may have the following tests or measurements:  Height, weight, and BMI.  Blood pressure.  Lipid and cholesterol levels. These may be checked every 5 years, or more frequently if you are over 74 years old.  Skin check.  Lung cancer screening. You may have this screening every year starting at age 30 if you have a 30-pack-year history of smoking and currently smoke or have quit within the past 15 years.  Fecal occult blood test (FOBT) of the stool. You may have this test every year starting at age 10.  Flexible sigmoidoscopy or colonoscopy. You may have a sigmoidoscopy every 5 years or a colonoscopy every 10 years starting at age 35.  Prostate cancer screening. Recommendations will vary depending on your family history and other risks.  Hepatitis C blood test.  Hepatitis B blood test.  Sexually transmitted disease (STD) testing.  Diabetes screening. This is done by checking your blood sugar (glucose) after you have not eaten for a while (fasting). You may have this done every 1-3 years.  Abdominal aortic aneurysm (AAA) screening. You may need this if you are a current or former smoker.  Osteoporosis. You may be screened starting at  age 48 if you are at high risk. Talk with your health care provider about your test results, treatment options, and if necessary, the need for more tests. Vaccines  Your health care provider may recommend certain vaccines, such as:  Influenza vaccine. This is recommended every  year.  Tetanus, diphtheria, and acellular pertussis (Tdap, Td) vaccine. You may need a Td booster every 10 years.  Zoster vaccine. You may need this after age 70.  Pneumococcal 13-valent conjugate (PCV13) vaccine. One dose is recommended after age 26.  Pneumococcal polysaccharide (PPSV23) vaccine. One dose is recommended after age 55. Talk to your health care provider about which screenings and vaccines you need and how often you need them. This information is not intended to replace advice given to you by your health care provider. Make sure you discuss any questions you have with your health care provider. Document Released: 12/11/2015 Document Revised: 08/03/2016 Document Reviewed: 09/15/2015 Elsevier Interactive Patient Education  2017 [place] Prevention in the Home Falls can cause injuries. They can happen to people of all ages. There are many things you can do to make your home safe and to help prevent falls. What can I do on the outside of my home?  Regularly fix the edges of walkways and driveways and fix any cracks.  Remove anything that might make you trip as you walk through a door, such as a raised step or threshold.  Trim any bushes or trees on the path to your home.  Use bright outdoor lighting.  Clear any walking paths of anything that might make someone trip, such as rocks or tools.  Regularly check to see if handrails are loose or broken. Make sure that both sides of any steps have handrails.  Any raised decks and porches should have guardrails on the edges.  Have any leaves, snow, or ice cleared regularly.  Use sand or salt on walking paths during winter.  Clean up any spills in your garage right away. This includes oil or grease spills. What can I do in the bathroom?  Use night lights.  Install grab bars by the toilet and in the tub and shower. Do not use towel bars as grab bars.  Use non-skid mats or decals in the tub or shower.  If you  need to sit down in the shower, use a plastic, non-slip stool.  Keep the floor dry. Clean up any water that spills on the floor as soon as it happens.  Remove soap buildup in the tub or shower regularly.  Attach bath mats securely with double-sided non-slip rug tape.  Do not have throw rugs and other things on the floor that can make you trip. What can I do in the bedroom?  Use night lights.  Make sure that you have a light by your bed that is easy to reach.  Do not use any sheets or blankets that are too big for your bed. They should not hang down onto the floor.  Have a firm chair that has side arms. You can use this for support while you get dressed.  Do not have throw rugs and other things on the floor that can make you trip. What can I do in the kitchen?  Clean up any spills right away.  Avoid walking on wet floors.  Keep items that you use a lot in easy-to-reach places.  If you need to reach something above you, use a strong step stool that has a grab bar.  Keep electrical cords out of the way.  Do not use floor polish or wax that makes floors slippery. If you must use wax, use non-skid floor wax.  Do not have throw rugs and other things on the floor that can make you trip. What can I do with my stairs?  Do not leave any items on the stairs.  Make sure that there are handrails on both sides of the stairs and use them. Fix handrails that are broken or loose. Make sure that handrails are as long as the stairways.  Check any carpeting to make sure that it is firmly attached to the stairs. Fix any carpet that is loose or worn.  Avoid having throw rugs at the top or bottom of the stairs. If you do have throw rugs, attach them to the floor with carpet tape.  Make sure that you have a light switch at the top of the stairs and the bottom of the stairs. If you do not have them, ask someone to add them for you. What else can I do to help prevent falls?  Wear shoes  that:  Do not have high heels.  Have rubber bottoms.  Are comfortable and fit you well.  Are closed at the toe. Do not wear sandals.  If you use a stepladder:  Make sure that it is fully opened. Do not climb a closed stepladder.  Make sure that both sides of the stepladder are locked into place.  Ask someone to hold it for you, if possible.  Clearly mark and make sure that you can see:  Any grab bars or handrails.  First and last steps.  Where the edge of each step is.  Use tools that help you move around (mobility aids) if they are needed. These include:  Canes.  Walkers.  Scooters.  Crutches.  Turn on the lights when you go into a dark area. Replace any light bulbs as soon as they burn out.  Set up your furniture so you have a clear path. Avoid moving your furniture around.  If any of your floors are uneven, fix them.  If there are any pets around you, be aware of where they are.  Review your medicines with your doctor. Some medicines can make you feel dizzy. This can increase your chance of falling. Ask your doctor what other things that you can do to help prevent falls. This information is not intended to replace advice given to you by your health care provider. Make sure you discuss any questions you have with your health care provider. Document Released: 09/10/2009 Document Revised: 04/21/2016 Document Reviewed: 12/19/2014 Elsevier Interactive Patient Education  2017 [employer].

## 2018-11-14 NOTE — Progress Notes (Signed)
Patient: Adam Chase Male    DOB: 10/08/1946   72 y.o.   MRN: [id number] Visit Date: 11/14/2018  Today's Provider: Lelon Huh, MD   Chief Complaint  Patient presents with  . Hypertension   Subjective:     HPI  Hypertension, follow-up:  BP Readings from Last 3 Encounters:  11/14/18 (!) 150/82  11/14/18 (!) 146/54  10/15/18 (!) 144/82    He was last seen for hypertension 1 years ago.  BP at that visit was 140/62. Management since that visit includes no changes. He reports good compliance with treatment. He is not having side effects.  He is not exercising. He is not adherent to low salt diet.   Outside blood pressures are not checked at home. He is experiencing dyspnea and lower extremity edema.  Patient denies chest pain, chest pressure/discomfort, claudication, exertional chest pressure/discomfort, fatigue, irregular heart beat, near-syncope, orthopnea, palpitations, paroxysmal nocturnal dyspnea, syncope and tachypnea.   Cardiovascular risk factors include advanced age (older than 32 for men, 86 for women), hypertension and male gender.  Use of agents associated with hypertension: NSAIDS.     Weight trend: fluctuating a bit Wt Readings from Last 3 Encounters:  11/14/18 273 lb (123.8 kg)  10/15/18 270 lb (122.5 kg)  10/05/18 269 lb (122 kg)    Current diet: in general, an "unhealthy" diet  ------------------------------------------------------------------------ Myasthenia gravis associated with thymoma: Patient was last seen for this problem 1 year ago and no changes were made. Patient was on Chronic prednisone.   Cellulitis of left lower leg: Was initially seen 10/05/2018 for wound originally from hitting leg with pitchfork several week prior. Was treated with doxy to cover for suspected MRSA, however cultures were negative and was changed to Augmentin 11/18 due to lesions not healing. He subsequently went to [facility] ER 10/21/2018 and was prescribed  clindamycin. Had normal venous doppler and xray of tib-fib. He reports since taking clindamycin swelling has gone down and pain has improved significantly, but still has ulcerations in legs.   He also reports he has been feeling short of breath on exertion for several weeks. No chest pain or heart flutters. No coughing or wheezing.   No Known Allergies   Current Outpatient Medications:  .  acetaminophen (TYLENOL) 500 MG tablet, Take 1,000 mg by mouth every 6 (six) hours as needed., Disp: , Rfl:  .  amLODipine (NORVASC) 5 MG tablet, TAKE 1 TABLET BY MOUTH EVERY DAY, Disp: 90 tablet, Rfl: 4 .  aspirin 81 MG tablet, Take 1 tablet by mouth daily., Disp: , Rfl:  .  B Complex-C (SUPER B COMPLEX PO), Take 2 tablets by mouth daily., Disp: , Rfl:  .  cholecalciferol (VITAMIN D) 1000 UNITS tablet, Take 1 tablet by mouth daily., Disp: , Rfl:  .  hydrochlorothiazide (HYDRODIURIL) 25 MG tablet, TAKE 1 TABLET BY MOUTH EVERY DAY, Disp: 90 tablet, Rfl: 4 .  Omega-3 Fatty Acids (FISH OIL) 1000 MG CAPS, Take 1 capsule by mouth daily., Disp: , Rfl:  .  predniSONE (DELTASONE) 10 MG tablet, Take 3 tablets by mouth daily. , Disp: , Rfl:  .  pyridostigmine (MESTINON) 60 MG tablet, Take 30 mg by mouth 3 (three) times daily. , Disp: , Rfl:  .  Skin Protectants, Misc. (EUCERIN) cream, Apply topically as needed for wound care., Disp: 397 g, Rfl: 0 .  potassium chloride SA (KLOR-CON M20) 20 MEQ tablet, Take 1 tablet by mouth daily., Disp: , Rfl:   Review  of Systems  Constitutional: Negative for appetite change, chills, fatigue and fever.  HENT: Negative for congestion, ear pain, hearing loss, nosebleeds and trouble swallowing.   Eyes: Negative for pain and visual disturbance.  Respiratory: Positive for shortness of breath. Negative for cough and chest tightness.   Cardiovascular: Positive for leg swelling. Negative for chest pain and palpitations.  Gastrointestinal: Negative for abdominal pain, blood in stool,  constipation, diarrhea, nausea and vomiting.  Endocrine: Negative for polydipsia, polyphagia and polyuria.  Genitourinary: Negative for dysuria and flank pain.  Musculoskeletal: Negative for arthralgias, back pain, joint swelling, myalgias and neck stiffness.  Skin: Positive for wound. Negative for color change and rash.  Neurological: Negative for dizziness, tremors, seizures, speech difficulty, weakness, light-headedness and headaches.  Psychiatric/Behavioral: Negative for behavioral problems, confusion, decreased concentration, dysphoric mood and sleep disturbance. The patient is not nervous/anxious.   All other systems reviewed and are negative.   Social History   Tobacco Use  . Smoking status: Former Smoker    Packs/day: 0.75    Years: 25.00    Pack years: 18.75    Types: Cigarettes    Last attempt to quit: 11/28/1990    Years since quitting: 27.9  . Smokeless tobacco: Current User    Types: Snuff  . Tobacco comment: has used snuff for 10-20 years; 1 can every 3 days  Substance Use Topics  . Alcohol use: Not Currently    Alcohol/week: 0.0 standard drinks      Objective:   BP (!) 150/82 (BP Location: Left Arm, Patient Position: Sitting, Cuff Size: Large)  Vitals:   11/14/18 1401  BP: (!) 150/82    BP 158/58 (BP Location: Right Arm)   Temp 97.9 F (36.6 C) (Oral)   Ht 6' (1.829 m)   Wt 273 lb (123.8 kg)   BMI 37.03 kg/m   BSA 2.51 m   Pain [place] 0-No pain   Physical Exam   General Appearance:    Alert, cooperative, no distress  Eyes:    PERRL, conjunctiva/corneas clear, EOM's intact       Lungs:     Clear to auscultation bilaterally, respirations unlabored  Heart:    Regular rate and rhythm  Neurologic:   Awake, alert, oriented x 3. No apparent focal neurological           defect.   Ext:   Cap refill LE 4 seconds. Weak pedal pulses.   Skin:   Several approx 1cm shallow ulcerations left anterior leg. 2+ pitting edema left leg, 1+ right. Minimal surrounding  erythema.        Assessment & Plan    1. Essential (primary) hypertension  - Comprehensive metabolic panel - CBC - TSH - Lipid panel - hydrochlorothiazide (HYDRODIURIL) 25 MG tablet; Take 2 tablets (50 mg total) by mouth daily.  Dispense: 3 tablet; Refill: 0 - potassium chloride SA (K-DUR,KLOR-CON) 20 MEQ tablet; Take 1 tablet (20 mEq total) by mouth daily.  Dispense: 30 tablet; Refill: 3  Increasing hctz from 25 to 2 x 25mg  a day, will send in prescription for 50mg  tablets before his current bottle runs out.   2. Personal history of colon cancer Reminded he is due for follow up colonoscopy. He states he has number to call to schedule and plans on calling after the new year.   3. Chronic ulcer of lower extremity, left, with unspecified severity ([place]) Persistent since initially injury in October. No improvement with Augmentin and doxycycline, but he does report improvement with clindamycin  which was refilled today. Suspect he may have PVD compromising normal healing.  - Ambulatory referral to Vascular Surgery  4. Prostate cancer screening  - PSA  5. . Dyspnea on exertion  - CBC - Brain natriuretic peptide  6. Cellulitis of left lower extremity Stat back on .  - clindamycin (CLEOCIN) 300 MG capsule; Take 2 capsules (600 mg total) by mouth 3 (three) times daily for 7 days.  Dispense: 42 capsule; Refill: 0    Lelon Huh, MD  [facility]

## 2018-11-15 LAB — COMPREHENSIVE METABOLIC PANEL
ALT: 31 IU/L (ref 0–44)
AST: 24 IU/L (ref 0–40)
Albumin/Globulin Ratio: 1.9 (ref 1.2–2.2)
Albumin: 4.4 g/dL (ref 3.5–4.8)
Alkaline Phosphatase: 52 IU/L (ref 39–117)
BUN / CREAT RATIO: 13 (ref 10–24)
BUN: 13 mg/dL (ref 8–27)
Bilirubin Total: 0.4 mg/dL (ref 0.0–1.2)
CO2: 23 mmol/L (ref 20–29)
Calcium: 9.9 mg/dL (ref 8.6–10.2)
Chloride: 97 mmol/L (ref 96–106)
Creatinine, Ser: 1.03 mg/dL (ref 0.76–1.27)
GFR calc non Af Amer: 72 mL/min/{1.73_m2} (ref >59)
GFR, EST AFRICAN AMERICAN: 84 mL/min/{1.73_m2} (ref >59)
GLOBULIN, TOTAL: 2.3 g/dL (ref 1.5–4.5)
Glucose: 152 mg/dL — ABNORMAL HIGH (ref 65–99)
Potassium: 3.4 mmol/L — ABNORMAL LOW (ref 3.5–5.2)
Sodium: 139 mmol/L (ref 134–144)
TOTAL PROTEIN: 6.7 g/dL (ref 6.0–8.5)

## 2018-11-15 LAB — PSA: Prostate Specific Ag, Serum: 0.4 ng/mL (ref 0.0–4.0)

## 2018-11-15 LAB — CBC
Hematocrit: 37.2 % — ABNORMAL LOW (ref 37.5–51.0)
Hemoglobin: 13.1 g/dL (ref 13.0–17.7)
MCH: 31.8 pg (ref 26.6–33.0)
MCHC: 35.2 g/dL (ref 31.5–35.7)
MCV: 90 fL (ref 79–97)
Platelets: 258 10*3/uL (ref 150–450)
RBC: 4.12 x10E6/uL — ABNORMAL LOW (ref 4.14–5.80)
RDW: 13.9 % (ref 12.3–15.4)
WBC: 12.6 10*3/uL — ABNORMAL HIGH (ref 3.4–10.8)

## 2018-11-15 LAB — TSH: TSH: 0.423 u[IU]/mL — ABNORMAL LOW (ref 0.450–4.500)

## 2018-11-15 LAB — LIPID PANEL
Chol/HDL Ratio: 2 ratio (ref 0.0–5.0)
Cholesterol, Total: 174 mg/dL (ref 100–199)
HDL: 88 mg/dL (ref >39)
LDL CALC: 61 mg/dL (ref 0–99)
Triglycerides: 123 mg/dL (ref 0–149)
VLDL Cholesterol Cal: 25 mg/dL (ref 5–40)

## 2018-11-15 LAB — BRAIN NATRIURETIC PEPTIDE: BNP: 36.6 pg/mL (ref 0.0–100.0)

## 2018-11-16 ENCOUNTER — Telehealth: Payer: Self-pay

## 2018-11-16 DIAGNOSIS — R0602 Shortness of breath: Secondary | ICD-10-CM

## 2018-11-16 NOTE — Telephone Encounter (Signed)
Ok to do xray after he returns, but if he gets worse he needs to go to urgent care.

## 2018-11-16 NOTE — Telephone Encounter (Signed)
I called [facility] customer service and tried adding this test on. I was advised that this test cannot be added to the sample drawn on 11/14/2018 because it requires frozen plasma from a blue top tube that contains sodium citrate.

## 2018-11-16 NOTE — Telephone Encounter (Signed)
Patient advised as directed below. 

## 2018-11-16 NOTE — Telephone Encounter (Signed)
Ok. Please advise patient that labs were normal. Need to get Chest Xray for shortness of breath. Order has been placed. He can go this afternoon he is able to get over there. Thanks.

## 2018-11-16 NOTE — Telephone Encounter (Signed)
Patient is planning on going out of town right now, and he states he wants to wait until he gets back in town on 11/30/2017 to have chest x ray. Is this OK to wait that long? Call patient back on his cell.

## 2018-11-16 NOTE — Telephone Encounter (Signed)
-----   Message from Birdie Sons, MD sent at 11/16/2018  8:01 AM EST ----- Please see if [facility] can add D-dimer for shortness of breath

## 2018-11-19 ENCOUNTER — Telehealth: Payer: Self-pay

## 2018-11-19 DIAGNOSIS — R7989 Other specified abnormal findings of blood chemistry: Secondary | ICD-10-CM

## 2018-11-19 DIAGNOSIS — R0602 Shortness of breath: Secondary | ICD-10-CM

## 2018-11-19 DIAGNOSIS — L03116 Cellulitis of left lower limb: Secondary | ICD-10-CM

## 2018-11-19 MED ORDER — CLINDAMYCIN HCL 300 MG PO CAPS: 600.0000 mg | ORAL_CAPSULE | Freq: Three times a day (TID) | ORAL | 0 refills | Status: AC

## 2018-11-19 NOTE — Telephone Encounter (Signed)
Have sent refill to [facility] in [place]. Will schedule CTA for next week. If he has any chest pains or worsening shortness of breath then go go ER.

## 2018-11-19 NOTE — Telephone Encounter (Signed)
Pt advised.   Thanks,   -Payton Prinsen  

## 2018-11-19 NOTE — Telephone Encounter (Signed)
[facility] uses [facility] in [place].   Thanks,   -Mickel Baas

## 2018-11-19 NOTE — Telephone Encounter (Signed)
Pt advised.  He is out of town until 11/29/2017 it is okay to wait until after he returns?  Pt states he is close to a hospital where he is staying now.    He also reports the cellulitis on his left lower leg is still red, swelling, and has some drainage.  He will finish the clindaymycin on Wednesday.  He wanted to know if he should get an extension on the antibiotic?    Please advise.

## 2018-11-19 NOTE — Telephone Encounter (Signed)
-----   Message from Birdie Sons, MD sent at 11/19/2018  7:27 AM EST ----- D-dimer is elevated. This can be caused by abnormal blood clots sometimes in the lungs which can cause shortness of breath. Need to schedule CTA of chest to rule out pulmonary embolism. If he has any chest pain or worsening shortness of breath then he needs to go to ER.

## 2018-11-19 NOTE — Addendum Note (Signed)
Addended by: Birdie Sons on: 11/19/2018 04:33 PM   Modules accepted: Orders

## 2018-11-20 LAB — SPECIMEN STATUS REPORT

## 2018-11-20 LAB — D-DIMER, QUANTITATIVE: D-DIMER: 1.51 mg/L FEU — ABNORMAL HIGH (ref 0.00–0.49)

## 2018-11-29 ENCOUNTER — Other Ambulatory Visit (INDEPENDENT_AMBULATORY_CARE_PROVIDER_SITE_OTHER): Payer: Self-pay | Admitting: Vascular Surgery

## 2018-11-29 DIAGNOSIS — M79605 Pain in left leg: Secondary | ICD-10-CM

## 2018-11-30 ENCOUNTER — Ambulatory Visit (INDEPENDENT_AMBULATORY_CARE_PROVIDER_SITE_OTHER): Payer: Medicare Other | Admitting: Vascular Surgery

## 2018-11-30 ENCOUNTER — Telehealth: Payer: Self-pay

## 2018-11-30 ENCOUNTER — Ambulatory Visit (INDEPENDENT_AMBULATORY_CARE_PROVIDER_SITE_OTHER): Payer: Medicare Other

## 2018-11-30 ENCOUNTER — Encounter: Payer: Self-pay | Admitting: Family Medicine

## 2018-11-30 ENCOUNTER — Encounter (INDEPENDENT_AMBULATORY_CARE_PROVIDER_SITE_OTHER): Payer: Self-pay | Admitting: Vascular Surgery

## 2018-11-30 ENCOUNTER — Telehealth (INDEPENDENT_AMBULATORY_CARE_PROVIDER_SITE_OTHER): Payer: Self-pay

## 2018-11-30 ENCOUNTER — Ambulatory Visit
Admission: RE | Admit: 2018-11-30 | Discharge: 2018-11-30 | Disposition: A | Discharge status: Home / Self Care | Payer: Medicare Other | Source: Ambulatory Visit | Attending: Family Medicine | Admitting: Family Medicine

## 2018-11-30 VITALS — BP 136/67 | HR 90 | Resp 16 | Ht 72.0 in | Wt 269.4 lb

## 2018-11-30 DIAGNOSIS — I10 Essential (primary) hypertension: Secondary | ICD-10-CM | POA: Diagnosis not present

## 2018-11-30 DIAGNOSIS — R7989 Other specified abnormal findings of blood chemistry: Secondary | ICD-10-CM | POA: Diagnosis not present

## 2018-11-30 DIAGNOSIS — R0602 Shortness of breath: Secondary | ICD-10-CM | POA: Insufficient documentation

## 2018-11-30 DIAGNOSIS — L97929 Non-pressure chronic ulcer of unspecified part of left lower leg with unspecified severity: Secondary | ICD-10-CM

## 2018-11-30 DIAGNOSIS — F17200 Nicotine dependence, unspecified, uncomplicated: Secondary | ICD-10-CM

## 2018-11-30 DIAGNOSIS — I700 Atherosclerosis of aorta: Secondary | ICD-10-CM

## 2018-11-30 DIAGNOSIS — M79605 Pain in left leg: Secondary | ICD-10-CM

## 2018-11-30 HISTORY — DX: Malignant neoplasm of colon, unspecified: C18.9

## 2018-11-30 MED ORDER — IOPAMIDOL (ISOVUE-370) INJECTION 76%
75.0000 mL | Freq: Once | INTRAVENOUS | Status: AC | PRN
  Administered 2018-11-30: 75 mL via INTRAVENOUS

## 2018-11-30 NOTE — Assessment & Plan Note (Signed)
Seen on a recent CT scan of the chest which I have independently reviewed.  It is fairly mild.  No PE.

## 2018-11-30 NOTE — Telephone Encounter (Signed)
-----   Message from Birdie Sons, MD sent at 11/30/2018 10:37 AM EST ----- Lungs are normal on CT, no sign of blood clots. Has mild cholesterol plaques of aorta, otherwise normal. .

## 2018-11-30 NOTE — Assessment & Plan Note (Signed)
blood pressure control important in reducing the progression of atherosclerotic disease. On appropriate oral medications.  

## 2018-11-30 NOTE — Telephone Encounter (Signed)
The patient left our office with a left leg Unna boot on. The patient resides in [place] and needed a homehealth to place the [employer] weekly until his return visit with [place] on 12/24/2018. I called Jana Half with [facility] and she will set the patient up with weekly Unna boot changes.

## 2018-11-30 NOTE — Progress Notes (Signed)
Patient ID: Adam Chase, male   DOB: September 28, 1946, 73 y.o.   MRN: [id number]  Chief Complaint  Patient presents with  . New Patient (Initial Visit)    HPI Adam Chase is a 73 y.o. male.  I am asked to see the patient by Dr. Caryn Section for evaluation of nonhealing ulcerations on the left leg.  The patient reports swelling and weeping with ulcerations on the left leg now over several weeks.  His left lateral ankle is very tender although there is not an open ulceration there is some redness and swelling in the area.  He does have weeping ulcerations on the lower calf anteriorly and laterally.  No right leg symptoms.  No claudication symptoms.  The swelling has been progressive over the years.  No previous history of DVT or trauma to the leg to his knowledge.  No fevers or chills. To assess his arterial perfusion ABIs were done today which were completely normal at 1.2 bilaterally with brisk triphasic waveforms.   Past Medical History:  Diagnosis Date  . Abdominal abscess 09/29/2015  . Colon cancer ([place]) 1992   Partial colon resection, chemo + rad tx's.   Marland Kitchen History of measles   . History of mumps   . Hypertension     Past Surgical History:  Procedure Laterality Date  . [place]  . TOTAL THYMECTOMY  2000's   [facility]    Family History  Problem Relation Age of Onset  . Stroke Mother   . Heart attack Other   . Colon cancer Other   . Lung cancer Other   No bleeding disorders, clotting disorders, or aneurysms  Social History Social History   Tobacco Use  . Smoking status: Former Smoker    Packs/day: 0.75    Years: 25.00    Pack years: 18.75    Types: Cigarettes    Last attempt to quit: 11/28/1990    Years since quitting: 28.0  . Smokeless tobacco: Current User    Types: Snuff  . Tobacco comment: has used snuff for 10-20 years; 1 can every 3 days  Substance Use Topics  . Alcohol use: Not Currently    Alcohol/week: 0.0 standard drinks  . Drug use: No    Retired, lives in [facility]  No Known Allergies  Current Outpatient Medications  Medication Sig Dispense Refill  . acetaminophen (TYLENOL) 500 MG tablet Take 1,000 mg by mouth every 6 (six) hours as needed.    Marland Kitchen amLODipine (NORVASC) 5 MG tablet TAKE 1 TABLET BY MOUTH EVERY DAY 90 tablet 4  . aspirin 81 MG tablet Take 1 tablet by mouth daily.    . B Complex-C (SUPER B COMPLEX PO) Take 2 tablets by mouth daily.    . cholecalciferol (VITAMIN D) 1000 UNITS tablet Take 1 tablet by mouth daily.    . hydrochlorothiazide (HYDRODIURIL) 25 MG tablet Take 2 tablets (50 mg total) by mouth daily. 3 tablet 0  . Omega-3 Fatty Acids (FISH OIL) 1000 MG CAPS Take 1 capsule by mouth daily.    . potassium chloride SA (K-DUR,KLOR-CON) 20 MEQ tablet Take 1 tablet (20 mEq total) by mouth daily. 30 tablet 3  . predniSONE (DELTASONE) 10 MG tablet Take 3 tablets by mouth daily.     Marland Kitchen pyridostigmine (MESTINON) 60 MG tablet Take 30 mg by mouth 3 (three) times daily.     . Skin Protectants, Misc. (EUCERIN) cream Apply topically as needed for wound care. [place]  g 0   No current facility-administered medications for this visit.       REVIEW OF SYSTEMS (Negative unless checked)  Constitutional: [] Weight loss  [] Fever  [] Chills Cardiac: [] Chest pain   [] Chest pressure   [] Palpitations   [] Shortness of breath when laying flat   [] Shortness of breath at rest   [] Shortness of breath with exertion. Vascular:  [x] Pain in legs with walking   [x] Pain in legs at rest   [] Pain in legs when laying flat   [] Claudication   [] Pain in feet when walking  [] Pain in feet at rest  [] Pain in feet when laying flat   [] History of DVT   [] Phlebitis   [x] Swelling in legs   [] Varicose veins   [x] Non-healing ulcers Pulmonary:   [] Uses home oxygen   [] Productive cough   [] Hemoptysis   [] Wheeze  [] COPD   [] Asthma Neurologic:  [] Dizziness  [] Blackouts   [] Seizures   [] History of stroke   [] History of TIA  [] Aphasia   [] Temporary  blindness   [] Dysphagia   [] Weakness or numbness in arms   [] Weakness or numbness in legs Musculoskeletal:  [] Arthritis   [] Joint swelling   [] Joint pain   [] Low back pain Hematologic:  [] Easy bruising  [] Easy bleeding   [] Hypercoagulable state   [] Anemic  [] Hepatitis Gastrointestinal:  [] Blood in stool   [] Vomiting blood  [] Gastroesophageal reflux/heartburn   [] Abdominal pain Genitourinary:  [] Chronic kidney disease   [] Difficult urination  [] Frequent urination  [] Burning with urination   [] Hematuria Skin:  [] Rashes   [x] Ulcers   [x] Wounds Psychological:  [] History of anxiety   []  History of major depression.    Physical Exam BP 136/67 (BP Location: Left Arm, Patient Position: Sitting)   Pulse 90   Resp 16   Ht 6' (1.829 m)   Wt 269 lb 6.4 oz (122.2 kg)   BMI 36.54 kg/m  Gen:  WD/WN, NAD Head: [place]/AT, No temporalis wasting.  Ear/Nose/Throat: Hearing grossly intact, nares w/o erythema or drainage, oropharynx w/o Erythema/Exudate Eyes: Conjunctiva clear, sclera non-icteric  Neck: trachea midline.   Pulmonary:  Good air movement, respirations not labored, no use of accessory muscles Cardiac: RRR, no JVD Vascular:  Vessel Right Left  Radial Palpable Palpable                          PT Palpable Trace Palpable  DP Palpable 1+ Palpable   Gastrointestinal: soft, non-tender/non-distended.  Musculoskeletal: M/S 5/5 throughout.  Extremities without ischemic changes.  No deformity or atrophy.  Multiple ulcerations on the left anterior and lateral lower leg.  The area on the left lateral malleolus of the ankle is red and inflamed but no open ulceration.  There is 2+ left lower extremity edema. Neurologic: Sensation grossly intact in extremities.  Symmetrical.  Speech is fluent. Motor exam as listed above. Psychiatric: Judgment intact, Mood & affect appropriate for pt's clinical situation. Dermatologic: Left leg wounds as above   Radiology Ct Angio Chest W/cm &/or Wo Cm  Result  Date: 11/30/2018 CLINICAL DATA:  Shortness of breath. History of colon carcinoma and myasthenia gravis EXAM: CT ANGIOGRAPHY CHEST WITH CONTRAST TECHNIQUE: Multidetector CT imaging of the chest was performed using the standard protocol during bolus administration of intravenous contrast. Multiplanar CT image reconstructions and MIPs were obtained to evaluate the vascular anatomy. CONTRAST:  80mL ISOVUE-370 IOPAMIDOL (ISOVUE-370) INJECTION 76% COMPARISON:  Chest CT November 04, 2004 FINDINGS: Cardiovascular: There is no demonstrable pulmonary embolus. There is no  thoracic aortic aneurysm. No dissection is seen. Note that the contrast bolus is not optimal for dissection assessment. Visualized great vessels appear unremarkable except for mild calcification at the origins of the right innominate and left common carotid arteries. There is aortic atherosclerosis. There are scattered foci of coronary artery calcification. There is no pericardial effusion or pericardial thickening. Mediastinum/Nodes: Visualized thyroid appears normal. A previously noted left-sided anterior mediastinal mass has been removed since previous study with surgical clips in this area. There is currently no demonstrable mediastinal mass. No adenopathy is noted in the thoracic region. No esophageal lesions are appreciable. Lungs/Pleura: There is mild lower lobe atelectatic change. 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 There is mild benign pleural thickening in the lung apex regions which is stable. There is no evident edema or consolidation. No pleural effusion evident. : Upper abdomen: There is aortic atherosclerosis. There are scattered colonic diverticula. Visualized upper abdominal structures appear unremarkable. Musculoskeletal: Patient is status post median sternotomy. There are no blastic or lytic bone lesions. No chest wall lesions are evident. IMPRESSION: 1. No demonstrable pulmonary embolus. No thoracic aortic  aneurysm. No dissection evident, although the contrast bolus is less than optimal for dissection assessment. There is aortic atherosclerosis as well as foci of proximal great vessel calcification and coronary artery calcification. 2. 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 Currently no mediastinal mass or adenopathy evident. 3. No lung edema or consolidation. Areas of mild benign pleural thickening near the apices. 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 4. Scattered transverse colonic diverticula without diverticulitis evident. Aortic Atherosclerosis (ICD10-I70.0). Electronically Signed   By: Lowella Grip III M.D.   On: 11/30/2018 08:48   Vas [place] Burnard Bunting With/wo Tbi  Result Date: 11/30/2018 LOWER EXTREMITY DOPPLER STUDY Indications: Left leg edema and lateral maleolus ankle wound.  Performing Technologist: Concha Norway RVT  Examination Guidelines: A complete evaluation includes at minimum, Doppler waveform signals and systolic blood pressure reading at the level of bilateral brachial, anterior tibial, and posterior tibial arteries, when vessel segments are accessible. Bilateral testing is considered an integral part of a complete examination. Photoelectric Plethysmograph (PPG) waveforms and toe systolic pressure readings are included as required and additional duplex testing as needed. Limited examinations for reoccurring indications may be performed as noted.  ABI Findings: +--------+------------------+-----+---------+--------+ Right   Rt Pressure (mmHg)IndexWaveform Comment  +--------+------------------+-----+---------+--------+ [id number]                                      +--------+------------------+-----+---------+--------+ ATA     172               1.19 triphasic         +--------+------------------+-----+---------+--------+ PTA     176               1.22 triphasic          +--------+------------------+-----+---------+--------+ +--------+------------------+-----+---------+-------+ Left    Lt Pressure (mmHg)IndexWaveform Comment +--------+------------------+-----+---------+-------+ [id number]                                     +--------+------------------+-----+---------+-------+ ATA     181               1.26 triphasic        +--------+------------------+-----+---------+-------+ PTA  176               1.22 triphasic        +--------+------------------+-----+---------+-------+ +-------+-----------+-----------+------------+------------+ ABI/TBIToday's ABIToday's TBIPrevious ABIPrevious TBI +-------+-----------+-----------+------------+------------+ Right  1.22                                           +-------+-----------+-----------+------------+------------+ Left   1.26                                           +-------+-----------+-----------+------------+------------+  Summary: Right: Resting right ankle-brachial index is within normal range. No evidence of significant right lower extremity arterial disease. Left: Resting left ankle-brachial index is within normal range. No evidence of significant left lower extremity arterial disease.  *See table(s) above for measurements and observations.  Electronically signed by Leotis Pain MD on 11/30/2018 at 12:45:06 PM.   Final     Labs Recent Results (from the past 2160 hour(s))  Aerobic Culture     Status: None   Collection Time: 10/05/18  4:30 PM  Result Value Ref Range   Aerobic Bacterial Culture Final report    Organism ID, Bacteria Mixed skin flora     Comment: Scant growth  Comprehensive metabolic panel     Status: Abnormal   Collection Time: 11/14/18  3:03 PM  Result Value Ref Range   Glucose 152 (H) 65 - 99 mg/dL   BUN 13 8 - 27 mg/dL   Creatinine, Ser 1.03 0.76 - 1.27 mg/dL   GFR calc non Af Amer 72 >59 mL/min/1.73   GFR calc Af Amer 84 >59 mL/min/1.73   BUN/Creatinine  Ratio 13 10 - 24   Sodium 139 134 - 144 mmol/L   Potassium 3.4 (L) 3.5 - 5.2 mmol/L   Chloride 97 96 - 106 mmol/L   CO2 23 20 - 29 mmol/L   Calcium 9.9 8.6 - 10.2 mg/dL   Total Protein 6.7 6.0 - 8.5 g/dL   Albumin 4.4 3.5 - 4.8 g/dL   Globulin, Total 2.3 1.5 - 4.5 g/dL   Albumin/Globulin Ratio 1.9 1.2 - 2.2   Bilirubin Total 0.4 0.0 - 1.2 mg/dL   Alkaline Phosphatase 52 39 - 117 IU/L   AST 24 0 - 40 IU/L   ALT 31 0 - 44 IU/L  CBC     Status: Abnormal   Collection Time: 11/14/18  3:03 PM  Result Value Ref Range   WBC 12.6 (H) 3.4 - 10.8 x10E3/uL   RBC 4.12 (L) 4.14 - 5.80 x10E6/uL   Hemoglobin 13.1 13.0 - 17.7 g/dL   Hematocrit 37.2 (L) 37.5 - 51.0 %   MCV 90 79 - 97 fL   MCH 31.8 26.6 - 33.0 pg   MCHC 35.2 31.5 - 35.7 g/dL   RDW 13.9 12.3 - 15.4 %    Comment: **Effective December 03, 2018, the RDW pediatric reference**   interval will be removed and the adult reference interval   will be changing to:                             Male 11.7 - 15.4  Male 11.6 - 15.4    Platelets 258 150 - 450 x10E3/uL  Brain natriuretic peptide     Status: None   Collection Time: 11/14/18  3:03 PM  Result Value Ref Range   BNP 36.6 0.0 - 100.0 pg/mL  TSH     Status: Abnormal   Collection Time: 11/14/18  3:03 PM  Result Value Ref Range   TSH 0.423 (L) 0.450 - 4.500 uIU/mL  PSA     Status: None   Collection Time: 11/14/18  3:03 PM  Result Value Ref Range   Prostate Specific Ag, Serum 0.4 0.0 - 4.0 ng/mL    Comment: Roche ECLIA methodology. According to the American Urological Association, Serum PSA should decrease and remain at undetectable levels after radical prostatectomy. The AUA defines biochemical recurrence as an initial PSA value 0.2 ng/mL or greater followed by a subsequent confirmatory PSA value 0.2 ng/mL or greater. Values obtained with different assay methods or kits cannot be used interchangeably. Results cannot be  interpreted as absolute evidence of the presence or absence of malignant disease.   Lipid panel     Status: None   Collection Time: 11/14/18  3:03 PM  Result Value Ref Range   Cholesterol, Total 174 100 - 199 mg/dL   Triglycerides 123 0 - 149 mg/dL   HDL 88 >39 mg/dL   VLDL Cholesterol Cal 25 5 - 40 mg/dL   LDL Calculated 61 0 - 99 mg/dL   Chol/HDL Ratio 2.0 0.0 - 5.0 ratio    Comment:                                   T. Chol/HDL Ratio                                             Men  Women                               1/2 Avg.Risk  3.4    3.3                                   Avg.Risk  5.0    4.4                                2X Avg.Risk  9.6    7.1                                3X Avg.Risk 23.4   11.0   D-dimer, quantitative (not at [facility])     Status: Abnormal   Collection Time: 11/14/18  3:03 PM  Result Value Ref Range   D-DIMER 1.51 (H) 0.00 - 0.49 mg/L FEU    Comment: According to the assay manufacturer's published package insert, a normal (<0.50 mg/L FEU) D-dimer result in conjunction with a non-high clinical probability assessment, excludes deep vein thrombosis (DVT) and pulmonary embolism (PE) with high sensitivity. D-dimer values increase with age and this can make VTE exclusion of an older population difficult. To address this, the [employer],  based on best available evidence and recent guidelines, recommends that clinicians use age-adjusted D-dimer thresholds in patients greater than 73 years of age with: a) a low probability of PE who do not meet all Pulmonary Embolism Rule Out Criteria, or b) in those with intermediate probability of PE. The formula for an age-adjusted D-dimer cut-off is "age/100". For example, a 73 year old patient would have an age-adjusted cut-off of 0.60 mg/L FEU and an 73 year old 0.80 mg/L FEU.   Specimen status report     Status: None   Collection Time: 11/14/18  3:03 PM  Result Value Ref Range   specimen status  report Comment     Comment: Written Authorization Written Authorization Written Authorization Received. Authorization received from Lelon Huh 11-20-2018 Logged by Kurtis Bushman     Assessment/Plan:  Essential (primary) hypertension blood pressure control important in reducing the progression of atherosclerotic disease. On appropriate oral medications.   Aortic atherosclerosis ([place]) Seen on a recent CT scan of the chest which I have independently reviewed.  It is fairly mild.  No PE.  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  I am suspicious he has venous disease and a venous reflux study should be done in the near future at his convenience.  We are going to wrap him a 3 layer Unna boot today.  This will need to be changed weekly.  He does not live locally, and we will try to get home health or an agent near to his home to be able to do the changes.  When he returns locally in about 3 weeks, we will see him back and do a venous reflux study.  We will continue the Unna boots until that time.      Leotis Pain 11/30/2018, 1:06 PM   This note was created with Dragon medical transcription system.  Any errors from dictation are unintentional.

## 2018-11-30 NOTE — Patient Instructions (Signed)

## 2018-11-30 NOTE — Assessment & Plan Note (Signed)
He does not have significant arterial insufficiency.  I am suspicious he has venous disease and a venous reflux study should be done in the near future at his convenience.  We are going to wrap him a 3 layer Unna boot today.  This will need to be changed weekly.  He does not live locally, and we will try to get home health or an agent near to his home to be able to do the changes.  When he returns locally in about 3 weeks, we will see him back and do a venous reflux study.  We will continue the Unna boots until that time.

## 2018-11-30 NOTE — Telephone Encounter (Signed)
Pt advised.   Thanks,   -Laura  

## 2018-12-04 ENCOUNTER — Telehealth (INDEPENDENT_AMBULATORY_CARE_PROVIDER_SITE_OTHER): Payer: Self-pay | Admitting: Nurse Practitioner

## 2018-12-04 NOTE — Telephone Encounter (Signed)
Spoke with the patient and gave him the office number to [facility] and the representatives name Jana Half. Patient then stated that the Urgent care in [place] will place the wrap, but they need the name of the medication on the wrap. I explained that it was a Zinc oxide wrap and two wraps of Coban.

## 2018-12-04 NOTE — Telephone Encounter (Signed)
Please advise on below  

## 2018-12-07 ENCOUNTER — Other Ambulatory Visit: Payer: Self-pay | Admitting: Family Medicine

## 2018-12-07 DIAGNOSIS — I10 Essential (primary) hypertension: Secondary | ICD-10-CM | POA: Diagnosis not present

## 2018-12-07 DIAGNOSIS — Z96642 Presence of left artificial hip joint: Secondary | ICD-10-CM | POA: Diagnosis not present

## 2018-12-07 DIAGNOSIS — Z7952 Long term (current) use of systemic steroids: Secondary | ICD-10-CM | POA: Diagnosis not present

## 2018-12-07 DIAGNOSIS — G4700 Insomnia, unspecified: Secondary | ICD-10-CM | POA: Diagnosis not present

## 2018-12-07 DIAGNOSIS — I872 Venous insufficiency (chronic) (peripheral): Secondary | ICD-10-CM | POA: Diagnosis not present

## 2018-12-07 DIAGNOSIS — M1710 Unilateral primary osteoarthritis, unspecified knee: Secondary | ICD-10-CM | POA: Diagnosis not present

## 2018-12-07 DIAGNOSIS — I700 Atherosclerosis of aorta: Secondary | ICD-10-CM | POA: Diagnosis not present

## 2018-12-07 DIAGNOSIS — L97822 Non-pressure chronic ulcer of other part of left lower leg with fat layer exposed: Secondary | ICD-10-CM | POA: Diagnosis not present

## 2018-12-07 DIAGNOSIS — F17210 Nicotine dependence, cigarettes, uncomplicated: Secondary | ICD-10-CM | POA: Diagnosis not present

## 2018-12-07 DIAGNOSIS — L97322 Non-pressure chronic ulcer of left ankle with fat layer exposed: Secondary | ICD-10-CM | POA: Diagnosis not present

## 2018-12-07 DIAGNOSIS — Z9181 History of falling: Secondary | ICD-10-CM | POA: Diagnosis not present

## 2018-12-07 DIAGNOSIS — D150 Benign neoplasm of thymus: Secondary | ICD-10-CM | POA: Diagnosis not present

## 2018-12-07 DIAGNOSIS — Z85038 Personal history of other malignant neoplasm of large intestine: Secondary | ICD-10-CM | POA: Diagnosis not present

## 2018-12-07 DIAGNOSIS — Z7982 Long term (current) use of aspirin: Secondary | ICD-10-CM | POA: Diagnosis not present

## 2018-12-07 MED ORDER — HYDROCHLOROTHIAZIDE 50 MG PO TABS: 50.0000 mg | ORAL_TABLET | Freq: Every day | ORAL | Status: DC

## 2018-12-10 ENCOUNTER — Telehealth (INDEPENDENT_AMBULATORY_CARE_PROVIDER_SITE_OTHER): Payer: Self-pay

## 2018-12-10 DIAGNOSIS — I700 Atherosclerosis of aorta: Secondary | ICD-10-CM | POA: Diagnosis not present

## 2018-12-10 DIAGNOSIS — I872 Venous insufficiency (chronic) (peripheral): Secondary | ICD-10-CM | POA: Diagnosis not present

## 2018-12-10 DIAGNOSIS — I10 Essential (primary) hypertension: Secondary | ICD-10-CM | POA: Diagnosis not present

## 2018-12-10 DIAGNOSIS — L97322 Non-pressure chronic ulcer of left ankle with fat layer exposed: Secondary | ICD-10-CM | POA: Diagnosis not present

## 2018-12-10 DIAGNOSIS — L97822 Non-pressure chronic ulcer of other part of left lower leg with fat layer exposed: Secondary | ICD-10-CM | POA: Diagnosis not present

## 2018-12-10 DIAGNOSIS — M1710 Unilateral primary osteoarthritis, unspecified knee: Secondary | ICD-10-CM | POA: Diagnosis not present

## 2018-12-10 NOTE — Telephone Encounter (Signed)
Otila Kluver from [facility] called and left a message on the triage line and stated that she needs verbal orders to continue the unna boot dressing changes. She also wanted to make a note that he had purulent discharge from his wound on the bandage.   Callback number is [phone]

## 2018-12-10 NOTE — Telephone Encounter (Signed)
Called to leave voicemail for Otila Kluver and it started beeping like a busy tone. Will try again later

## 2018-12-10 NOTE — Telephone Encounter (Signed)
Attempted to leave a vm for Otila Kluver, but received a fast busy tone once it was time to record

## 2018-12-11 NOTE — Telephone Encounter (Signed)
Unable to contact after multiple attempts

## 2018-12-11 NOTE — Telephone Encounter (Signed)
Left a message to call the office back since when trying to leave the verbal order the voicemail beeps and disconnects before it can be completed

## 2018-12-12 ENCOUNTER — Telehealth (INDEPENDENT_AMBULATORY_CARE_PROVIDER_SITE_OTHER): Payer: Self-pay | Admitting: Vascular Surgery

## 2018-12-12 NOTE — Telephone Encounter (Signed)
Pt left vm stating he has noticed more blisters coming up and he is unsure of what to do about it. They are not leaking so far.   Please advise

## 2018-12-13 ENCOUNTER — Telehealth (INDEPENDENT_AMBULATORY_CARE_PROVIDER_SITE_OTHER): Payer: Self-pay | Admitting: Vascular Surgery

## 2018-12-13 NOTE — Telephone Encounter (Signed)
I am unable to call in a prescription for anything until I am aware of what is the cause of the blisters however looking at his history it may be due to swelling and excessive fluid in the lower extremities.  He can try utilizing some bacitracin ointment on the blisters.  This actually does not require a prescription, this can be purchased over-the-counter at [facility].

## 2018-12-13 NOTE — Telephone Encounter (Signed)
Please advise on below  

## 2018-12-13 NOTE — Telephone Encounter (Signed)
Patient stated that they are re wrapping his unna boot tomorrow but he stated that his blisters are above his knee. He is going to purchase bacitracin and use that until his appointment. Sending back to you just as a FYI for the blister locations.

## 2018-12-14 DIAGNOSIS — M1710 Unilateral primary osteoarthritis, unspecified knee: Secondary | ICD-10-CM | POA: Diagnosis not present

## 2018-12-14 DIAGNOSIS — L97822 Non-pressure chronic ulcer of other part of left lower leg with fat layer exposed: Secondary | ICD-10-CM | POA: Diagnosis not present

## 2018-12-14 DIAGNOSIS — I700 Atherosclerosis of aorta: Secondary | ICD-10-CM | POA: Diagnosis not present

## 2018-12-14 DIAGNOSIS — I872 Venous insufficiency (chronic) (peripheral): Secondary | ICD-10-CM | POA: Diagnosis not present

## 2018-12-14 DIAGNOSIS — L97322 Non-pressure chronic ulcer of left ankle with fat layer exposed: Secondary | ICD-10-CM | POA: Diagnosis not present

## 2018-12-14 DIAGNOSIS — I10 Essential (primary) hypertension: Secondary | ICD-10-CM | POA: Diagnosis not present

## 2018-12-17 ENCOUNTER — Other Ambulatory Visit: Payer: Self-pay

## 2018-12-17 ENCOUNTER — Encounter (INDEPENDENT_AMBULATORY_CARE_PROVIDER_SITE_OTHER): Payer: Self-pay | Admitting: Vascular Surgery

## 2018-12-17 ENCOUNTER — Ambulatory Visit (INDEPENDENT_AMBULATORY_CARE_PROVIDER_SITE_OTHER): Payer: Medicare Other | Admitting: Vascular Surgery

## 2018-12-17 ENCOUNTER — Other Ambulatory Visit: Payer: Self-pay | Admitting: Family Medicine

## 2018-12-17 VITALS — BP 145/75 | HR 89 | Resp 16 | Ht 72.0 in | Wt 269.0 lb

## 2018-12-17 DIAGNOSIS — I10 Essential (primary) hypertension: Secondary | ICD-10-CM

## 2018-12-17 DIAGNOSIS — I700 Atherosclerosis of aorta: Secondary | ICD-10-CM | POA: Diagnosis not present

## 2018-12-17 DIAGNOSIS — L97929 Non-pressure chronic ulcer of unspecified part of left lower leg with unspecified severity: Secondary | ICD-10-CM | POA: Diagnosis not present

## 2018-12-17 MED ORDER — SILVER SULFADIAZINE 1 % EX CREA: 1.0000 "application " | TOPICAL_CREAM | Freq: Every day | CUTANEOUS | 5 refills | Status: DC

## 2018-12-17 MED ORDER — CEPHALEXIN 500 MG PO CAPS: 500.0000 mg | ORAL_CAPSULE | Freq: Four times a day (QID) | ORAL | 0 refills | Status: DC

## 2018-12-17 NOTE — Telephone Encounter (Signed)
Vein center is wanting to increase his fluid pills.  Dr there asked pt to discuss this with his PCP. He is wanting to nurse about the increase.  Please advise.  Thanks, [employer]

## 2018-12-17 NOTE — Progress Notes (Signed)
Subjective:    Patient ID: Adam Chase, male    DOB: Mar 19, 1946, 73 y.o.   MRN: [id number] Chief Complaint  Patient presents with  . Follow-up    sores on left leg    Patient presents with a chief complaint of new ulceration to the left inner thigh times " few days".  The area is "tender" and "red".  The patient continues to receive weekly three layer zinc oxide unna wraps to the left lower extremity for chronic ulcerations and edema.  The patient has yet to make an appointment with the wound center for local wound care.  The patient states that he has been elevating his legs and remaining active. Denies any recent trauma to the left lower extremity.  The patient notes the to the new "blisters" are dry and they are not weeping any fluid. The patient denies any fever, nausea vomiting.  11/30/18 ABI:  Right  1.22        +-------+---- Left   1.26    +-------+----  Right: Resting right ankle-brachial index is within normal range. No evidence of significant right lower extremity arterial disease. Left: Resting left ankle-brachial index is within normal range. No evidence of significant left lower extremity arterial disease.  Review of Systems  Constitutional: Negative.   HENT: Negative.   Eyes: Negative.   Respiratory: Negative.   Cardiovascular: Negative.   Gastrointestinal: Negative.   Endocrine: Negative.   Genitourinary: Negative.   Musculoskeletal: Negative.   Skin: Positive for wound.  Allergic/Immunologic: Negative.   Neurological: Negative.   Hematological: Negative.   Psychiatric/Behavioral: Negative.       Objective:   Physical Exam Vitals signs reviewed.  Constitutional:      Appearance: Normal appearance.  HENT:     Head: Normocephalic and atraumatic.     Right Ear: External ear normal.     Left Ear: External ear normal.     Nose: Nose normal.     Mouth/Throat:     Mouth: Mucous membranes are moist.     Pharynx: Oropharynx is clear.  Eyes:   Extraocular Movements: Extraocular movements intact.     Conjunctiva/sclera: Conjunctivae normal.     Pupils: Pupils are equal, round, and reactive to light.  Neck:     Musculoskeletal: Normal range of motion and neck supple.  Cardiovascular:     Rate and Rhythm: Normal rate and regular rhythm.     Comments: Hard to palpate pedal pulses due to body habitus and edema however the bilateral feet are warm.  There is a good capillary refill noted. Pulmonary:     Effort: Pulmonary effort is normal. No respiratory distress.     Breath sounds: Normal breath sounds. No wheezing.  Musculoskeletal: Normal range of motion.        General: Swelling (Left lower extremity: There is some mild to moderate edema noted to the patient's left thigh.  Moderate edema distally to the toes.  Lower extremity: Mild to moderate edema noted.) present.  Skin:    Comments: Multiple shallow ulcerations noted to the calf.  Some with bloody drainage, some with serosanguineous drainage.  Some with 100% fibrinous exudate in the wound bed.  There are 2 new ulcerations noted to the medial thigh.  Some surrounding cellulitis.  They are not draining any fluid at this time.  Neurological:     General: No focal deficit present.     Mental Status: He is alert and oriented to person, place, and time.  Psychiatric:  Mood and Affect: Mood normal.        Thought Content: Thought content normal.        Judgment: Judgment normal.    BP (!) 145/75   Pulse 89   Resp 16   Ht 6' (1.829 m)   Wt 269 lb (122 kg)   BMI 36.48 kg/m    Past Medical History:  Diagnosis Date  . Abdominal abscess 09/29/2015  . Colon cancer ([place]) 1992   Partial colon resection, chemo + rad tx's.   Marland Kitchen History of measles   . History of mumps   . Hypertension    Social History   Socioeconomic History  . Marital status: Married    Spouse name: Not on file  . Number of children: 2  . Years of education: Not on file  . Highest education level: High  school graduate  Occupational History  . Occupation: Glass blower/designer; Textiles    Comment: retired  Scientific laboratory technician  . Financial resource strain: Not hard at all  . Food insecurity:    Worry: Never true    Inability: Never true  . Transportation needs:    Medical: No    Non-medical: No  Tobacco Use  . Smoking status: Former Smoker    Packs/day: 0.75    Years: 25.00    Pack years: 18.75    Types: Cigarettes    Last attempt to quit: 11/28/1990    Years since quitting: 28.0  . Smokeless tobacco: Current User    Types: Snuff  . Tobacco comment: has used snuff for 10-20 years; 1 can every 3 days  Substance and Sexual Activity  . Alcohol use: Not Currently    Alcohol/week: 0.0 standard drinks  . Drug use: No  . Sexual activity: Not on file  Lifestyle  . Physical activity:    Days per week: 0 days    Minutes per session: 0 min  . Stress: Very much  Relationships  . Social connections:    Talks on phone: Patient refused    Gets together: Patient refused    Attends religious service: Patient refused    Active member of club or organization: Patient refused    Attends meetings of clubs or organizations: Patient refused    Relationship status: Patient refused  . Intimate partner violence:    Fear of current or ex partner: Patient refused    Emotionally abused: Patient refused    Physically abused: Patient refused    Forced sexual activity: Patient refused  Other Topics Concern  . Not on file  Social History Narrative  . Not on file   Past Surgical History:  Procedure Laterality Date  . [place]  . TOTAL THYMECTOMY  2000's   [facility]   Family History  Problem Relation Age of Onset  . Stroke Mother   . Heart attack Other   . Colon cancer Other   . Lung cancer Other    No Known Allergies     Assessment & Plan:   Patient presents with a chief complaint of new ulceration to the left inner thigh times " few days".  The area is "tender" and "red".  The  patient continues to receive weekly three layer zinc oxide unna wraps to the left lower extremity for chronic ulcerations and edema.  The patient has yet to make an appointment with the wound center for local wound care.  The patient states that he has been elevating his legs and remaining active.  Denies any recent trauma to the left lower extremity.  The patient notes the to the new "blisters" are dry and they are not weeping any fluid. The patient denies any fever, nausea vomiting.  11/30/18 ABI:  Right  1.22        +-------+---- Left   1.26    +-------+----  Right: Resting right ankle-brachial index is within normal range. No evidence of significant right lower extremity arterial disease. Left: Resting left ankle-brachial index is within normal range. No evidence of significant left lower extremity arterial disease.  1. Chronic ulcer of lower extremity, left, with unspecified severity ([place]) - Stable Patient's most recent ABI was normal with triphasic tibials. Encourage the patient to make /keep his appointment with the wound center as he is in need of local wound care to his ulcerations.  We had a long conversation about the importance of local wound care and why it is needed.  The patient expresses understanding and states that he will reach out to the wound center.  I have placed another referral. Patient to continue 3 layer zinc oxide Unna wraps to the left lower extremity.  This was placed today. I will prescribe zinc oxide for the 2 ulcerations on the medial thigh.  The patient is to cover them with a dry gauze.  The patient is to wrap his thigh with an Ace wrap and attempt to control the edema in his thigh.  I recommend the patient touch base with his primary care Dr. Caryn Section in regard to possible adjusting his diuretic medication We reviewed proper elevation as heart level higher as much as possible Patient is to remain active The patient is to continue weekly 3 layer zinc oxide and  wraps to the left lower extremity.  Recommend the patient follow-up in 1 month to assess his wounds and progress and possibly discuss the addition of lymphedema pump.  The patient would like to call to make this appointment and not make the appointment today.  2. Essential (primary) hypertension - Stable Encouraged good control as its slows the progression of atherosclerotic disease  3. Aortic atherosclerosis ([place]) - Stable At this time, the patient presents asymptomatically. This is followed on a regular basis.  Current Outpatient Medications on File Prior to Visit  Medication Sig Dispense Refill  . acetaminophen (TYLENOL) 500 MG tablet Take 1,000 mg by mouth every 6 (six) hours as needed.    Marland Kitchen amLODipine (NORVASC) 5 MG tablet TAKE 1 TABLET BY MOUTH EVERY DAY 90 tablet 4  . aspirin 81 MG tablet Take 1 tablet by mouth daily.    . B Complex-C (SUPER B COMPLEX PO) Take 2 tablets by mouth daily.    . cholecalciferol (VITAMIN D) 1000 UNITS tablet Take 1 tablet by mouth daily.    . hydrochlorothiazide (HYDRODIURIL) 50 MG tablet Take 1 tablet (50 mg total) by mouth daily.    . Omega-3 Fatty Acids (FISH OIL) 1000 MG CAPS Take 1 capsule by mouth daily.    . potassium chloride SA (K-DUR,KLOR-CON) 20 MEQ tablet Take 1 tablet (20 mEq total) by mouth daily. 30 tablet 3  . pyridostigmine (MESTINON) 60 MG tablet Take 30 mg by mouth 3 (three) times daily.     . Skin Protectants, Misc. (EUCERIN) cream Apply topically as needed for wound care. 397 g 0  . predniSONE (DELTASONE) 10 MG tablet Take 3 tablets by mouth daily.      No current facility-administered medications on file prior to visit.    There  are no Patient Instructions on file for this visit. Return in about 1 month (around 01/17/2019), or if symptoms worsen or fail to improve.  Revis Whalin A Admire Bunnell, PA-C

## 2018-12-18 NOTE — Telephone Encounter (Signed)
Left vm for pt to call back.  dbs

## 2018-12-19 NOTE — Telephone Encounter (Signed)
Patient was seen by Vascular doctor Adam Overlie, PA-C on 12/17/2018 for chronic ulcer of left lower extremity. Adam Chase documented in the office note that in attempt to control edema in his thigh, patient should wrap his thigh with an Ace wrap and  touch base with his primary care Dr. Caryn Chase in regard to possible adjusting his diuretic medication. I called patient, and verified that he is currently taking HCTZ 50mg  daily. Please advise on medication adjustment.

## 2018-12-21 DIAGNOSIS — I10 Essential (primary) hypertension: Secondary | ICD-10-CM | POA: Diagnosis not present

## 2018-12-21 DIAGNOSIS — I872 Venous insufficiency (chronic) (peripheral): Secondary | ICD-10-CM | POA: Diagnosis not present

## 2018-12-21 DIAGNOSIS — M1710 Unilateral primary osteoarthritis, unspecified knee: Secondary | ICD-10-CM | POA: Diagnosis not present

## 2018-12-21 DIAGNOSIS — I700 Atherosclerosis of aorta: Secondary | ICD-10-CM | POA: Diagnosis not present

## 2018-12-21 DIAGNOSIS — L97322 Non-pressure chronic ulcer of left ankle with fat layer exposed: Secondary | ICD-10-CM | POA: Diagnosis not present

## 2018-12-21 DIAGNOSIS — L97822 Non-pressure chronic ulcer of other part of left lower leg with fat layer exposed: Secondary | ICD-10-CM | POA: Diagnosis not present

## 2018-12-21 NOTE — Telephone Encounter (Signed)
LMTCB 12/21/2018  Thanks,   -Mickel Baas

## 2018-12-21 NOTE — Telephone Encounter (Signed)
The hctz is not a very strong diuretic, it is mainly for blood pressure. Recommend adding spironolactone 25mg , 1/2 tablet a daily for 14 days, then increase to 1 tablet daily #30 1 rf. Follow up o.v in 1 month.

## 2018-12-24 ENCOUNTER — Ambulatory Visit (INDEPENDENT_AMBULATORY_CARE_PROVIDER_SITE_OTHER): Payer: Medicare Other | Admitting: Nurse Practitioner

## 2018-12-24 ENCOUNTER — Encounter (INDEPENDENT_AMBULATORY_CARE_PROVIDER_SITE_OTHER): Payer: Medicare Other

## 2018-12-24 DIAGNOSIS — L97122 Non-pressure chronic ulcer of left thigh with fat layer exposed: Secondary | ICD-10-CM | POA: Diagnosis not present

## 2018-12-24 DIAGNOSIS — L02416 Cutaneous abscess of left lower limb: Secondary | ICD-10-CM | POA: Diagnosis not present

## 2018-12-24 DIAGNOSIS — L97822 Non-pressure chronic ulcer of other part of left lower leg with fat layer exposed: Secondary | ICD-10-CM | POA: Diagnosis not present

## 2018-12-24 DIAGNOSIS — L97222 Non-pressure chronic ulcer of left calf with fat layer exposed: Secondary | ICD-10-CM | POA: Diagnosis not present

## 2018-12-24 DIAGNOSIS — I10 Essential (primary) hypertension: Secondary | ICD-10-CM | POA: Diagnosis not present

## 2018-12-24 DIAGNOSIS — L97322 Non-pressure chronic ulcer of left ankle with fat layer exposed: Secondary | ICD-10-CM | POA: Diagnosis not present

## 2018-12-24 DIAGNOSIS — G7000 Myasthenia gravis without (acute) exacerbation: Secondary | ICD-10-CM | POA: Diagnosis not present

## 2018-12-24 DIAGNOSIS — L97929 Non-pressure chronic ulcer of unspecified part of left lower leg with unspecified severity: Secondary | ICD-10-CM | POA: Diagnosis not present

## 2018-12-24 DIAGNOSIS — L97102 Non-pressure chronic ulcer of unspecified thigh with fat layer exposed: Secondary | ICD-10-CM | POA: Diagnosis not present

## 2018-12-24 DIAGNOSIS — L308 Other specified dermatitis: Secondary | ICD-10-CM | POA: Diagnosis not present

## 2018-12-24 DIAGNOSIS — L97829 Non-pressure chronic ulcer of other part of left lower leg with unspecified severity: Secondary | ICD-10-CM | POA: Diagnosis not present

## 2018-12-24 DIAGNOSIS — L97129 Non-pressure chronic ulcer of left thigh with unspecified severity: Secondary | ICD-10-CM | POA: Diagnosis not present

## 2018-12-24 MED ORDER — HYDROCHLOROTHIAZIDE 50 MG PO TABS: 50.0000 mg | ORAL_TABLET | Freq: Every day | ORAL | 5 refills | Status: DC

## 2018-12-24 MED ORDER — SPIRONOLACTONE 25 MG PO TABS: ORAL_TABLET | ORAL | 1 refills | Status: DC

## 2018-12-24 NOTE — Telephone Encounter (Signed)
Patient advised as below. Patient requesting refill on hctz

## 2018-12-26 DIAGNOSIS — M1710 Unilateral primary osteoarthritis, unspecified knee: Secondary | ICD-10-CM | POA: Diagnosis not present

## 2018-12-26 DIAGNOSIS — I10 Essential (primary) hypertension: Secondary | ICD-10-CM | POA: Diagnosis not present

## 2018-12-26 DIAGNOSIS — L97322 Non-pressure chronic ulcer of left ankle with fat layer exposed: Secondary | ICD-10-CM | POA: Diagnosis not present

## 2018-12-26 DIAGNOSIS — L97822 Non-pressure chronic ulcer of other part of left lower leg with fat layer exposed: Secondary | ICD-10-CM | POA: Diagnosis not present

## 2018-12-26 DIAGNOSIS — I872 Venous insufficiency (chronic) (peripheral): Secondary | ICD-10-CM | POA: Diagnosis not present

## 2018-12-26 DIAGNOSIS — I700 Atherosclerosis of aorta: Secondary | ICD-10-CM | POA: Diagnosis not present

## 2018-12-28 DIAGNOSIS — M1710 Unilateral primary osteoarthritis, unspecified knee: Secondary | ICD-10-CM | POA: Diagnosis not present

## 2018-12-28 DIAGNOSIS — S81809A Unspecified open wound, unspecified lower leg, initial encounter: Secondary | ICD-10-CM | POA: Diagnosis not present

## 2018-12-28 DIAGNOSIS — I10 Essential (primary) hypertension: Secondary | ICD-10-CM | POA: Diagnosis not present

## 2018-12-28 DIAGNOSIS — L97322 Non-pressure chronic ulcer of left ankle with fat layer exposed: Secondary | ICD-10-CM | POA: Diagnosis not present

## 2018-12-28 DIAGNOSIS — I872 Venous insufficiency (chronic) (peripheral): Secondary | ICD-10-CM | POA: Diagnosis not present

## 2018-12-28 DIAGNOSIS — L97822 Non-pressure chronic ulcer of other part of left lower leg with fat layer exposed: Secondary | ICD-10-CM | POA: Diagnosis not present

## 2018-12-28 DIAGNOSIS — I700 Atherosclerosis of aorta: Secondary | ICD-10-CM | POA: Diagnosis not present

## 2018-12-29 DIAGNOSIS — L97929 Non-pressure chronic ulcer of unspecified part of left lower leg with unspecified severity: Secondary | ICD-10-CM | POA: Diagnosis not present

## 2018-12-29 DIAGNOSIS — D72828 Other elevated white blood cell count: Secondary | ICD-10-CM | POA: Diagnosis not present

## 2018-12-29 DIAGNOSIS — Z923 Personal history of irradiation: Secondary | ICD-10-CM | POA: Diagnosis not present

## 2018-12-29 DIAGNOSIS — M25572 Pain in left ankle and joints of left foot: Secondary | ICD-10-CM | POA: Diagnosis present

## 2018-12-29 DIAGNOSIS — L02416 Cutaneous abscess of left lower limb: Secondary | ICD-10-CM | POA: Diagnosis not present

## 2018-12-29 DIAGNOSIS — L859 Epidermal thickening, unspecified: Secondary | ICD-10-CM | POA: Diagnosis present

## 2018-12-29 DIAGNOSIS — R600 Localized edema: Secondary | ICD-10-CM | POA: Diagnosis present

## 2018-12-29 DIAGNOSIS — L821 Other seborrheic keratosis: Secondary | ICD-10-CM | POA: Diagnosis present

## 2018-12-29 DIAGNOSIS — A431 Cutaneous nocardiosis: Secondary | ICD-10-CM | POA: Diagnosis not present

## 2018-12-29 DIAGNOSIS — R21 Rash and other nonspecific skin eruption: Secondary | ICD-10-CM | POA: Diagnosis not present

## 2018-12-29 DIAGNOSIS — Z7952 Long term (current) use of systemic steroids: Secondary | ICD-10-CM | POA: Diagnosis not present

## 2018-12-29 DIAGNOSIS — I10 Essential (primary) hypertension: Secondary | ICD-10-CM | POA: Diagnosis present

## 2018-12-29 DIAGNOSIS — Z7982 Long term (current) use of aspirin: Secondary | ICD-10-CM | POA: Diagnosis not present

## 2018-12-29 DIAGNOSIS — Z79899 Other long term (current) drug therapy: Secondary | ICD-10-CM | POA: Diagnosis not present

## 2018-12-29 DIAGNOSIS — B353 Tinea pedis: Secondary | ICD-10-CM | POA: Diagnosis present

## 2018-12-29 DIAGNOSIS — L97922 Non-pressure chronic ulcer of unspecified part of left lower leg with fat layer exposed: Secondary | ICD-10-CM | POA: Diagnosis not present

## 2018-12-29 DIAGNOSIS — D72829 Elevated white blood cell count, unspecified: Secondary | ICD-10-CM | POA: Diagnosis present

## 2018-12-29 DIAGNOSIS — M1990 Unspecified osteoarthritis, unspecified site: Secondary | ICD-10-CM | POA: Diagnosis present

## 2018-12-29 DIAGNOSIS — L97921 Non-pressure chronic ulcer of unspecified part of left lower leg limited to breakdown of skin: Secondary | ICD-10-CM | POA: Diagnosis not present

## 2018-12-29 DIAGNOSIS — D150 Benign neoplasm of thymus: Secondary | ICD-10-CM | POA: Diagnosis not present

## 2018-12-29 DIAGNOSIS — E669 Obesity, unspecified: Secondary | ICD-10-CM | POA: Diagnosis present

## 2018-12-29 DIAGNOSIS — Z9221 Personal history of antineoplastic chemotherapy: Secondary | ICD-10-CM | POA: Diagnosis not present

## 2018-12-29 DIAGNOSIS — Z6837 Body mass index (BMI) 37.0-37.9, adult: Secondary | ICD-10-CM | POA: Diagnosis not present

## 2018-12-29 DIAGNOSIS — Z9049 Acquired absence of other specified parts of digestive tract: Secondary | ICD-10-CM | POA: Diagnosis not present

## 2018-12-29 DIAGNOSIS — T380X5A Adverse effect of glucocorticoids and synthetic analogues, initial encounter: Secondary | ICD-10-CM | POA: Diagnosis present

## 2018-12-29 DIAGNOSIS — B965 Pseudomonas (aeruginosa) (mallei) (pseudomallei) as the cause of diseases classified elsewhere: Secondary | ICD-10-CM | POA: Diagnosis not present

## 2018-12-29 DIAGNOSIS — L97329 Non-pressure chronic ulcer of left ankle with unspecified severity: Secondary | ICD-10-CM | POA: Diagnosis not present

## 2018-12-29 DIAGNOSIS — S81832S Puncture wound without foreign body, left lower leg, sequela: Secondary | ICD-10-CM | POA: Diagnosis not present

## 2018-12-29 DIAGNOSIS — A439 Nocardiosis, unspecified: Secondary | ICD-10-CM | POA: Diagnosis not present

## 2018-12-29 DIAGNOSIS — Z85048 Personal history of other malignant neoplasm of rectum, rectosigmoid junction, and anus: Secondary | ICD-10-CM | POA: Diagnosis not present

## 2018-12-29 DIAGNOSIS — L0390 Cellulitis, unspecified: Secondary | ICD-10-CM | POA: Diagnosis not present

## 2018-12-29 DIAGNOSIS — G7000 Myasthenia gravis without (acute) exacerbation: Secondary | ICD-10-CM | POA: Diagnosis not present

## 2018-12-29 DIAGNOSIS — L03116 Cellulitis of left lower limb: Secondary | ICD-10-CM | POA: Diagnosis not present

## 2019-01-05 DIAGNOSIS — I10 Essential (primary) hypertension: Secondary | ICD-10-CM | POA: Diagnosis not present

## 2019-01-05 DIAGNOSIS — L97822 Non-pressure chronic ulcer of other part of left lower leg with fat layer exposed: Secondary | ICD-10-CM | POA: Diagnosis not present

## 2019-01-05 DIAGNOSIS — I872 Venous insufficiency (chronic) (peripheral): Secondary | ICD-10-CM | POA: Diagnosis not present

## 2019-01-05 DIAGNOSIS — I700 Atherosclerosis of aorta: Secondary | ICD-10-CM | POA: Diagnosis not present

## 2019-01-05 DIAGNOSIS — L97322 Non-pressure chronic ulcer of left ankle with fat layer exposed: Secondary | ICD-10-CM | POA: Diagnosis not present

## 2019-01-05 DIAGNOSIS — M1710 Unilateral primary osteoarthritis, unspecified knee: Secondary | ICD-10-CM | POA: Diagnosis not present

## 2019-01-06 DIAGNOSIS — I10 Essential (primary) hypertension: Secondary | ICD-10-CM | POA: Diagnosis not present

## 2019-01-06 DIAGNOSIS — Z9181 History of falling: Secondary | ICD-10-CM | POA: Diagnosis not present

## 2019-01-06 DIAGNOSIS — Z96642 Presence of left artificial hip joint: Secondary | ICD-10-CM | POA: Diagnosis not present

## 2019-01-06 DIAGNOSIS — Z85038 Personal history of other malignant neoplasm of large intestine: Secondary | ICD-10-CM | POA: Diagnosis not present

## 2019-01-06 DIAGNOSIS — I872 Venous insufficiency (chronic) (peripheral): Secondary | ICD-10-CM | POA: Diagnosis not present

## 2019-01-06 DIAGNOSIS — L97822 Non-pressure chronic ulcer of other part of left lower leg with fat layer exposed: Secondary | ICD-10-CM | POA: Diagnosis not present

## 2019-01-06 DIAGNOSIS — D150 Benign neoplasm of thymus: Secondary | ICD-10-CM | POA: Diagnosis not present

## 2019-01-06 DIAGNOSIS — G4700 Insomnia, unspecified: Secondary | ICD-10-CM | POA: Diagnosis not present

## 2019-01-06 DIAGNOSIS — I700 Atherosclerosis of aorta: Secondary | ICD-10-CM | POA: Diagnosis not present

## 2019-01-06 DIAGNOSIS — F17210 Nicotine dependence, cigarettes, uncomplicated: Secondary | ICD-10-CM | POA: Diagnosis not present

## 2019-01-06 DIAGNOSIS — G7000 Myasthenia gravis without (acute) exacerbation: Secondary | ICD-10-CM | POA: Diagnosis not present

## 2019-01-06 DIAGNOSIS — L97322 Non-pressure chronic ulcer of left ankle with fat layer exposed: Secondary | ICD-10-CM | POA: Diagnosis not present

## 2019-01-06 DIAGNOSIS — M1710 Unilateral primary osteoarthritis, unspecified knee: Secondary | ICD-10-CM | POA: Diagnosis not present

## 2019-01-07 ENCOUNTER — Telehealth (INDEPENDENT_AMBULATORY_CARE_PROVIDER_SITE_OTHER): Payer: Self-pay

## 2019-01-07 DIAGNOSIS — L03116 Cellulitis of left lower limb: Secondary | ICD-10-CM | POA: Diagnosis not present

## 2019-01-07 DIAGNOSIS — A439 Nocardiosis, unspecified: Secondary | ICD-10-CM | POA: Diagnosis not present

## 2019-01-07 DIAGNOSIS — G7000 Myasthenia gravis without (acute) exacerbation: Secondary | ICD-10-CM | POA: Diagnosis not present

## 2019-01-07 DIAGNOSIS — Z0000 Encounter for general adult medical examination without abnormal findings: Secondary | ICD-10-CM | POA: Diagnosis not present

## 2019-01-07 DIAGNOSIS — Z5181 Encounter for therapeutic drug level monitoring: Secondary | ICD-10-CM | POA: Diagnosis not present

## 2019-01-07 NOTE — Telephone Encounter (Signed)
Dena from [facility] called and left a message on the triage line and stated that the patient was released from [facility] on Friday. She said she needs verbal orders for 1. Resume home health visits 2. To saturate kurlex in wound cleanser, place and leave for 10-15 min then wash area and apply silver foam and wrap with kurlex.  Please advise

## 2019-01-07 NOTE — Telephone Encounter (Signed)
Coralyn Helling is aware

## 2019-01-09 DIAGNOSIS — G7000 Myasthenia gravis without (acute) exacerbation: Secondary | ICD-10-CM | POA: Diagnosis not present

## 2019-01-09 DIAGNOSIS — I872 Venous insufficiency (chronic) (peripheral): Secondary | ICD-10-CM | POA: Diagnosis not present

## 2019-01-09 DIAGNOSIS — I10 Essential (primary) hypertension: Secondary | ICD-10-CM | POA: Diagnosis not present

## 2019-01-09 DIAGNOSIS — M1710 Unilateral primary osteoarthritis, unspecified knee: Secondary | ICD-10-CM | POA: Diagnosis not present

## 2019-01-09 DIAGNOSIS — L97322 Non-pressure chronic ulcer of left ankle with fat layer exposed: Secondary | ICD-10-CM | POA: Diagnosis not present

## 2019-01-09 DIAGNOSIS — L97822 Non-pressure chronic ulcer of other part of left lower leg with fat layer exposed: Secondary | ICD-10-CM | POA: Diagnosis not present

## 2019-01-11 DIAGNOSIS — I872 Venous insufficiency (chronic) (peripheral): Secondary | ICD-10-CM | POA: Diagnosis not present

## 2019-01-11 DIAGNOSIS — L97822 Non-pressure chronic ulcer of other part of left lower leg with fat layer exposed: Secondary | ICD-10-CM | POA: Diagnosis not present

## 2019-01-11 DIAGNOSIS — G7000 Myasthenia gravis without (acute) exacerbation: Secondary | ICD-10-CM | POA: Diagnosis not present

## 2019-01-11 DIAGNOSIS — I10 Essential (primary) hypertension: Secondary | ICD-10-CM | POA: Diagnosis not present

## 2019-01-11 DIAGNOSIS — M1710 Unilateral primary osteoarthritis, unspecified knee: Secondary | ICD-10-CM | POA: Diagnosis not present

## 2019-01-11 DIAGNOSIS — L97322 Non-pressure chronic ulcer of left ankle with fat layer exposed: Secondary | ICD-10-CM | POA: Diagnosis not present

## 2019-01-14 DIAGNOSIS — L97322 Non-pressure chronic ulcer of left ankle with fat layer exposed: Secondary | ICD-10-CM | POA: Diagnosis not present

## 2019-01-14 DIAGNOSIS — M1710 Unilateral primary osteoarthritis, unspecified knee: Secondary | ICD-10-CM | POA: Diagnosis not present

## 2019-01-14 DIAGNOSIS — L97822 Non-pressure chronic ulcer of other part of left lower leg with fat layer exposed: Secondary | ICD-10-CM | POA: Diagnosis not present

## 2019-01-14 DIAGNOSIS — I10 Essential (primary) hypertension: Secondary | ICD-10-CM | POA: Diagnosis not present

## 2019-01-14 DIAGNOSIS — G7000 Myasthenia gravis without (acute) exacerbation: Secondary | ICD-10-CM | POA: Diagnosis not present

## 2019-01-14 DIAGNOSIS — I872 Venous insufficiency (chronic) (peripheral): Secondary | ICD-10-CM | POA: Diagnosis not present

## 2019-01-16 ENCOUNTER — Other Ambulatory Visit: Payer: Self-pay | Admitting: Family Medicine

## 2019-01-16 DIAGNOSIS — L97822 Non-pressure chronic ulcer of other part of left lower leg with fat layer exposed: Secondary | ICD-10-CM | POA: Diagnosis not present

## 2019-01-16 DIAGNOSIS — M1710 Unilateral primary osteoarthritis, unspecified knee: Secondary | ICD-10-CM | POA: Diagnosis not present

## 2019-01-16 DIAGNOSIS — G7000 Myasthenia gravis without (acute) exacerbation: Secondary | ICD-10-CM | POA: Diagnosis not present

## 2019-01-16 DIAGNOSIS — I10 Essential (primary) hypertension: Secondary | ICD-10-CM | POA: Diagnosis not present

## 2019-01-16 DIAGNOSIS — L97322 Non-pressure chronic ulcer of left ankle with fat layer exposed: Secondary | ICD-10-CM | POA: Diagnosis not present

## 2019-01-16 DIAGNOSIS — I872 Venous insufficiency (chronic) (peripheral): Secondary | ICD-10-CM | POA: Diagnosis not present

## 2019-01-18 DIAGNOSIS — G7000 Myasthenia gravis without (acute) exacerbation: Secondary | ICD-10-CM | POA: Diagnosis not present

## 2019-01-18 DIAGNOSIS — I872 Venous insufficiency (chronic) (peripheral): Secondary | ICD-10-CM | POA: Diagnosis not present

## 2019-01-18 DIAGNOSIS — I10 Essential (primary) hypertension: Secondary | ICD-10-CM | POA: Diagnosis not present

## 2019-01-18 DIAGNOSIS — L97322 Non-pressure chronic ulcer of left ankle with fat layer exposed: Secondary | ICD-10-CM | POA: Diagnosis not present

## 2019-01-18 DIAGNOSIS — M1710 Unilateral primary osteoarthritis, unspecified knee: Secondary | ICD-10-CM | POA: Diagnosis not present

## 2019-01-18 DIAGNOSIS — L97822 Non-pressure chronic ulcer of other part of left lower leg with fat layer exposed: Secondary | ICD-10-CM | POA: Diagnosis not present

## 2019-01-21 ENCOUNTER — Ambulatory Visit (INDEPENDENT_AMBULATORY_CARE_PROVIDER_SITE_OTHER): Payer: Medicare Other | Admitting: Vascular Surgery

## 2019-01-21 DIAGNOSIS — I872 Venous insufficiency (chronic) (peripheral): Secondary | ICD-10-CM | POA: Diagnosis not present

## 2019-01-21 DIAGNOSIS — I10 Essential (primary) hypertension: Secondary | ICD-10-CM | POA: Diagnosis not present

## 2019-01-21 DIAGNOSIS — M1710 Unilateral primary osteoarthritis, unspecified knee: Secondary | ICD-10-CM | POA: Diagnosis not present

## 2019-01-21 DIAGNOSIS — L97322 Non-pressure chronic ulcer of left ankle with fat layer exposed: Secondary | ICD-10-CM | POA: Diagnosis not present

## 2019-01-21 DIAGNOSIS — G7000 Myasthenia gravis without (acute) exacerbation: Secondary | ICD-10-CM | POA: Diagnosis not present

## 2019-01-21 DIAGNOSIS — L97822 Non-pressure chronic ulcer of other part of left lower leg with fat layer exposed: Secondary | ICD-10-CM | POA: Diagnosis not present

## 2019-01-23 DIAGNOSIS — G7000 Myasthenia gravis without (acute) exacerbation: Secondary | ICD-10-CM | POA: Diagnosis not present

## 2019-01-23 DIAGNOSIS — I10 Essential (primary) hypertension: Secondary | ICD-10-CM | POA: Diagnosis not present

## 2019-01-23 DIAGNOSIS — L97822 Non-pressure chronic ulcer of other part of left lower leg with fat layer exposed: Secondary | ICD-10-CM | POA: Diagnosis not present

## 2019-01-23 DIAGNOSIS — M1710 Unilateral primary osteoarthritis, unspecified knee: Secondary | ICD-10-CM | POA: Diagnosis not present

## 2019-01-23 DIAGNOSIS — L97322 Non-pressure chronic ulcer of left ankle with fat layer exposed: Secondary | ICD-10-CM | POA: Diagnosis not present

## 2019-01-23 DIAGNOSIS — I872 Venous insufficiency (chronic) (peripheral): Secondary | ICD-10-CM | POA: Diagnosis not present

## 2019-01-25 ENCOUNTER — Ambulatory Visit: Payer: Self-pay | Admitting: Family Medicine

## 2019-01-25 DIAGNOSIS — L97822 Non-pressure chronic ulcer of other part of left lower leg with fat layer exposed: Secondary | ICD-10-CM | POA: Diagnosis not present

## 2019-01-25 DIAGNOSIS — G7000 Myasthenia gravis without (acute) exacerbation: Secondary | ICD-10-CM | POA: Diagnosis not present

## 2019-01-25 DIAGNOSIS — I872 Venous insufficiency (chronic) (peripheral): Secondary | ICD-10-CM | POA: Diagnosis not present

## 2019-01-25 DIAGNOSIS — L97322 Non-pressure chronic ulcer of left ankle with fat layer exposed: Secondary | ICD-10-CM | POA: Diagnosis not present

## 2019-01-25 DIAGNOSIS — I10 Essential (primary) hypertension: Secondary | ICD-10-CM | POA: Diagnosis not present

## 2019-01-25 DIAGNOSIS — M1710 Unilateral primary osteoarthritis, unspecified knee: Secondary | ICD-10-CM | POA: Diagnosis not present

## 2019-01-28 DIAGNOSIS — I872 Venous insufficiency (chronic) (peripheral): Secondary | ICD-10-CM | POA: Diagnosis not present

## 2019-01-28 DIAGNOSIS — I10 Essential (primary) hypertension: Secondary | ICD-10-CM | POA: Diagnosis not present

## 2019-01-28 DIAGNOSIS — G7000 Myasthenia gravis without (acute) exacerbation: Secondary | ICD-10-CM | POA: Diagnosis not present

## 2019-01-28 DIAGNOSIS — L97822 Non-pressure chronic ulcer of other part of left lower leg with fat layer exposed: Secondary | ICD-10-CM | POA: Diagnosis not present

## 2019-01-28 DIAGNOSIS — L97322 Non-pressure chronic ulcer of left ankle with fat layer exposed: Secondary | ICD-10-CM | POA: Diagnosis not present

## 2019-01-28 DIAGNOSIS — M1710 Unilateral primary osteoarthritis, unspecified knee: Secondary | ICD-10-CM | POA: Diagnosis not present

## 2019-01-30 DIAGNOSIS — M1710 Unilateral primary osteoarthritis, unspecified knee: Secondary | ICD-10-CM | POA: Diagnosis not present

## 2019-01-30 DIAGNOSIS — I10 Essential (primary) hypertension: Secondary | ICD-10-CM | POA: Diagnosis not present

## 2019-01-30 DIAGNOSIS — Z13220 Encounter for screening for lipoid disorders: Secondary | ICD-10-CM | POA: Diagnosis not present

## 2019-01-30 DIAGNOSIS — L97822 Non-pressure chronic ulcer of other part of left lower leg with fat layer exposed: Secondary | ICD-10-CM | POA: Diagnosis not present

## 2019-01-30 DIAGNOSIS — Z125 Encounter for screening for malignant neoplasm of prostate: Secondary | ICD-10-CM | POA: Diagnosis not present

## 2019-01-30 DIAGNOSIS — I872 Venous insufficiency (chronic) (peripheral): Secondary | ICD-10-CM | POA: Diagnosis not present

## 2019-01-30 DIAGNOSIS — L97322 Non-pressure chronic ulcer of left ankle with fat layer exposed: Secondary | ICD-10-CM | POA: Diagnosis not present

## 2019-01-30 DIAGNOSIS — G7000 Myasthenia gravis without (acute) exacerbation: Secondary | ICD-10-CM | POA: Diagnosis not present

## 2019-02-01 DIAGNOSIS — Z7952 Long term (current) use of systemic steroids: Secondary | ICD-10-CM | POA: Diagnosis not present

## 2019-02-01 DIAGNOSIS — L97922 Non-pressure chronic ulcer of unspecified part of left lower leg with fat layer exposed: Secondary | ICD-10-CM | POA: Diagnosis not present

## 2019-02-01 DIAGNOSIS — G7000 Myasthenia gravis without (acute) exacerbation: Secondary | ICD-10-CM | POA: Diagnosis not present

## 2019-02-01 DIAGNOSIS — A439 Nocardiosis, unspecified: Secondary | ICD-10-CM | POA: Diagnosis not present

## 2019-02-01 DIAGNOSIS — D150 Benign neoplasm of thymus: Secondary | ICD-10-CM | POA: Diagnosis not present

## 2019-02-04 DIAGNOSIS — L97322 Non-pressure chronic ulcer of left ankle with fat layer exposed: Secondary | ICD-10-CM | POA: Diagnosis not present

## 2019-02-04 DIAGNOSIS — L97822 Non-pressure chronic ulcer of other part of left lower leg with fat layer exposed: Secondary | ICD-10-CM | POA: Diagnosis not present

## 2019-02-04 DIAGNOSIS — I10 Essential (primary) hypertension: Secondary | ICD-10-CM | POA: Diagnosis not present

## 2019-02-04 DIAGNOSIS — G7000 Myasthenia gravis without (acute) exacerbation: Secondary | ICD-10-CM | POA: Diagnosis not present

## 2019-02-04 DIAGNOSIS — M1710 Unilateral primary osteoarthritis, unspecified knee: Secondary | ICD-10-CM | POA: Diagnosis not present

## 2019-02-04 DIAGNOSIS — I872 Venous insufficiency (chronic) (peripheral): Secondary | ICD-10-CM | POA: Diagnosis not present

## 2019-02-05 DIAGNOSIS — G7000 Myasthenia gravis without (acute) exacerbation: Secondary | ICD-10-CM | POA: Diagnosis not present

## 2019-02-05 DIAGNOSIS — I10 Essential (primary) hypertension: Secondary | ICD-10-CM | POA: Diagnosis not present

## 2019-02-05 DIAGNOSIS — L97822 Non-pressure chronic ulcer of other part of left lower leg with fat layer exposed: Secondary | ICD-10-CM | POA: Diagnosis not present

## 2019-02-05 DIAGNOSIS — D150 Benign neoplasm of thymus: Secondary | ICD-10-CM | POA: Diagnosis not present

## 2019-02-05 DIAGNOSIS — F17210 Nicotine dependence, cigarettes, uncomplicated: Secondary | ICD-10-CM | POA: Diagnosis not present

## 2019-02-05 DIAGNOSIS — Z7982 Long term (current) use of aspirin: Secondary | ICD-10-CM | POA: Diagnosis not present

## 2019-02-05 DIAGNOSIS — M1710 Unilateral primary osteoarthritis, unspecified knee: Secondary | ICD-10-CM | POA: Diagnosis not present

## 2019-02-05 DIAGNOSIS — Z9181 History of falling: Secondary | ICD-10-CM | POA: Diagnosis not present

## 2019-02-05 DIAGNOSIS — G4700 Insomnia, unspecified: Secondary | ICD-10-CM | POA: Diagnosis not present

## 2019-02-05 DIAGNOSIS — Z85038 Personal history of other malignant neoplasm of large intestine: Secondary | ICD-10-CM | POA: Diagnosis not present

## 2019-02-05 DIAGNOSIS — Z96642 Presence of left artificial hip joint: Secondary | ICD-10-CM | POA: Diagnosis not present

## 2019-02-05 DIAGNOSIS — I700 Atherosclerosis of aorta: Secondary | ICD-10-CM | POA: Diagnosis not present

## 2019-02-05 DIAGNOSIS — I872 Venous insufficiency (chronic) (peripheral): Secondary | ICD-10-CM | POA: Diagnosis not present

## 2019-02-06 ENCOUNTER — Other Ambulatory Visit: Payer: Self-pay | Admitting: Family Medicine

## 2019-02-06 DIAGNOSIS — I10 Essential (primary) hypertension: Secondary | ICD-10-CM

## 2019-02-08 DIAGNOSIS — I10 Essential (primary) hypertension: Secondary | ICD-10-CM | POA: Diagnosis not present

## 2019-02-08 DIAGNOSIS — N183 Chronic kidney disease, stage 3 (moderate): Secondary | ICD-10-CM | POA: Diagnosis not present

## 2019-02-08 DIAGNOSIS — G7000 Myasthenia gravis without (acute) exacerbation: Secondary | ICD-10-CM | POA: Diagnosis not present

## 2019-02-08 DIAGNOSIS — A439 Nocardiosis, unspecified: Secondary | ICD-10-CM | POA: Diagnosis not present

## 2019-02-11 ENCOUNTER — Other Ambulatory Visit: Payer: Self-pay | Admitting: Family Medicine

## 2019-02-11 DIAGNOSIS — I872 Venous insufficiency (chronic) (peripheral): Secondary | ICD-10-CM | POA: Diagnosis not present

## 2019-02-11 DIAGNOSIS — I10 Essential (primary) hypertension: Secondary | ICD-10-CM | POA: Diagnosis not present

## 2019-02-11 DIAGNOSIS — L97822 Non-pressure chronic ulcer of other part of left lower leg with fat layer exposed: Secondary | ICD-10-CM | POA: Diagnosis not present

## 2019-02-11 DIAGNOSIS — I700 Atherosclerosis of aorta: Secondary | ICD-10-CM | POA: Diagnosis not present

## 2019-02-11 DIAGNOSIS — G7000 Myasthenia gravis without (acute) exacerbation: Secondary | ICD-10-CM | POA: Diagnosis not present

## 2019-02-11 DIAGNOSIS — M1710 Unilateral primary osteoarthritis, unspecified knee: Secondary | ICD-10-CM | POA: Diagnosis not present

## 2019-02-18 DIAGNOSIS — L97822 Non-pressure chronic ulcer of other part of left lower leg with fat layer exposed: Secondary | ICD-10-CM | POA: Diagnosis not present

## 2019-02-18 DIAGNOSIS — M1710 Unilateral primary osteoarthritis, unspecified knee: Secondary | ICD-10-CM | POA: Diagnosis not present

## 2019-02-18 DIAGNOSIS — I872 Venous insufficiency (chronic) (peripheral): Secondary | ICD-10-CM | POA: Diagnosis not present

## 2019-02-18 DIAGNOSIS — G7000 Myasthenia gravis without (acute) exacerbation: Secondary | ICD-10-CM | POA: Diagnosis not present

## 2019-02-18 DIAGNOSIS — I10 Essential (primary) hypertension: Secondary | ICD-10-CM | POA: Diagnosis not present

## 2019-02-18 DIAGNOSIS — I700 Atherosclerosis of aorta: Secondary | ICD-10-CM | POA: Diagnosis not present

## 2019-02-25 DIAGNOSIS — M1710 Unilateral primary osteoarthritis, unspecified knee: Secondary | ICD-10-CM | POA: Diagnosis not present

## 2019-02-25 DIAGNOSIS — I872 Venous insufficiency (chronic) (peripheral): Secondary | ICD-10-CM | POA: Diagnosis not present

## 2019-02-25 DIAGNOSIS — I10 Essential (primary) hypertension: Secondary | ICD-10-CM | POA: Diagnosis not present

## 2019-02-25 DIAGNOSIS — L97822 Non-pressure chronic ulcer of other part of left lower leg with fat layer exposed: Secondary | ICD-10-CM | POA: Diagnosis not present

## 2019-02-25 DIAGNOSIS — I739 Peripheral vascular disease, unspecified: Secondary | ICD-10-CM | POA: Diagnosis not present

## 2019-02-25 DIAGNOSIS — I700 Atherosclerosis of aorta: Secondary | ICD-10-CM | POA: Diagnosis not present

## 2019-02-25 DIAGNOSIS — I871 Compression of vein: Secondary | ICD-10-CM | POA: Diagnosis not present

## 2019-02-25 DIAGNOSIS — G7000 Myasthenia gravis without (acute) exacerbation: Secondary | ICD-10-CM | POA: Diagnosis not present

## 2019-03-03 DIAGNOSIS — I10 Essential (primary) hypertension: Secondary | ICD-10-CM | POA: Diagnosis not present

## 2019-03-03 DIAGNOSIS — G7000 Myasthenia gravis without (acute) exacerbation: Secondary | ICD-10-CM | POA: Diagnosis not present

## 2019-03-03 DIAGNOSIS — I872 Venous insufficiency (chronic) (peripheral): Secondary | ICD-10-CM | POA: Diagnosis not present

## 2019-03-03 DIAGNOSIS — M1710 Unilateral primary osteoarthritis, unspecified knee: Secondary | ICD-10-CM | POA: Diagnosis not present

## 2019-03-03 DIAGNOSIS — I700 Atherosclerosis of aorta: Secondary | ICD-10-CM | POA: Diagnosis not present

## 2019-03-03 DIAGNOSIS — L97822 Non-pressure chronic ulcer of other part of left lower leg with fat layer exposed: Secondary | ICD-10-CM | POA: Diagnosis not present

## 2019-03-07 DIAGNOSIS — Z9181 History of falling: Secondary | ICD-10-CM | POA: Diagnosis not present

## 2019-03-07 DIAGNOSIS — Z7982 Long term (current) use of aspirin: Secondary | ICD-10-CM | POA: Diagnosis not present

## 2019-03-07 DIAGNOSIS — M1710 Unilateral primary osteoarthritis, unspecified knee: Secondary | ICD-10-CM | POA: Diagnosis not present

## 2019-03-07 DIAGNOSIS — Z85038 Personal history of other malignant neoplasm of large intestine: Secondary | ICD-10-CM | POA: Diagnosis not present

## 2019-03-07 DIAGNOSIS — D150 Benign neoplasm of thymus: Secondary | ICD-10-CM | POA: Diagnosis not present

## 2019-03-07 DIAGNOSIS — I872 Venous insufficiency (chronic) (peripheral): Secondary | ICD-10-CM | POA: Diagnosis not present

## 2019-03-07 DIAGNOSIS — F17210 Nicotine dependence, cigarettes, uncomplicated: Secondary | ICD-10-CM | POA: Diagnosis not present

## 2019-03-07 DIAGNOSIS — Z96642 Presence of left artificial hip joint: Secondary | ICD-10-CM | POA: Diagnosis not present

## 2019-03-07 DIAGNOSIS — G4700 Insomnia, unspecified: Secondary | ICD-10-CM | POA: Diagnosis not present

## 2019-03-07 DIAGNOSIS — L97822 Non-pressure chronic ulcer of other part of left lower leg with fat layer exposed: Secondary | ICD-10-CM | POA: Diagnosis not present

## 2019-03-07 DIAGNOSIS — I10 Essential (primary) hypertension: Secondary | ICD-10-CM | POA: Diagnosis not present

## 2019-03-07 DIAGNOSIS — G7000 Myasthenia gravis without (acute) exacerbation: Secondary | ICD-10-CM | POA: Diagnosis not present

## 2019-03-07 DIAGNOSIS — I700 Atherosclerosis of aorta: Secondary | ICD-10-CM | POA: Diagnosis not present

## 2019-03-11 DIAGNOSIS — G7000 Myasthenia gravis without (acute) exacerbation: Secondary | ICD-10-CM | POA: Diagnosis not present

## 2019-03-11 DIAGNOSIS — I700 Atherosclerosis of aorta: Secondary | ICD-10-CM | POA: Diagnosis not present

## 2019-03-11 DIAGNOSIS — I872 Venous insufficiency (chronic) (peripheral): Secondary | ICD-10-CM | POA: Diagnosis not present

## 2019-03-11 DIAGNOSIS — L97822 Non-pressure chronic ulcer of other part of left lower leg with fat layer exposed: Secondary | ICD-10-CM | POA: Diagnosis not present

## 2019-03-11 DIAGNOSIS — I10 Essential (primary) hypertension: Secondary | ICD-10-CM | POA: Diagnosis not present

## 2019-03-11 DIAGNOSIS — M1710 Unilateral primary osteoarthritis, unspecified knee: Secondary | ICD-10-CM | POA: Diagnosis not present

## 2019-03-18 DIAGNOSIS — L97822 Non-pressure chronic ulcer of other part of left lower leg with fat layer exposed: Secondary | ICD-10-CM | POA: Diagnosis not present

## 2019-03-18 DIAGNOSIS — G7000 Myasthenia gravis without (acute) exacerbation: Secondary | ICD-10-CM | POA: Diagnosis not present

## 2019-03-18 DIAGNOSIS — I700 Atherosclerosis of aorta: Secondary | ICD-10-CM | POA: Diagnosis not present

## 2019-03-18 DIAGNOSIS — I10 Essential (primary) hypertension: Secondary | ICD-10-CM | POA: Diagnosis not present

## 2019-03-18 DIAGNOSIS — M1710 Unilateral primary osteoarthritis, unspecified knee: Secondary | ICD-10-CM | POA: Diagnosis not present

## 2019-03-18 DIAGNOSIS — Z85038 Personal history of other malignant neoplasm of large intestine: Secondary | ICD-10-CM | POA: Diagnosis not present

## 2019-03-18 DIAGNOSIS — I872 Venous insufficiency (chronic) (peripheral): Secondary | ICD-10-CM | POA: Diagnosis not present

## 2019-03-25 DIAGNOSIS — I872 Venous insufficiency (chronic) (peripheral): Secondary | ICD-10-CM | POA: Diagnosis not present

## 2019-03-25 DIAGNOSIS — I700 Atherosclerosis of aorta: Secondary | ICD-10-CM | POA: Diagnosis not present

## 2019-03-25 DIAGNOSIS — L97822 Non-pressure chronic ulcer of other part of left lower leg with fat layer exposed: Secondary | ICD-10-CM | POA: Diagnosis not present

## 2019-03-25 DIAGNOSIS — G7000 Myasthenia gravis without (acute) exacerbation: Secondary | ICD-10-CM | POA: Diagnosis not present

## 2019-03-25 DIAGNOSIS — I10 Essential (primary) hypertension: Secondary | ICD-10-CM | POA: Diagnosis not present

## 2019-03-25 DIAGNOSIS — M1710 Unilateral primary osteoarthritis, unspecified knee: Secondary | ICD-10-CM | POA: Diagnosis not present

## 2019-04-01 DIAGNOSIS — G7000 Myasthenia gravis without (acute) exacerbation: Secondary | ICD-10-CM | POA: Diagnosis not present

## 2019-04-01 DIAGNOSIS — I10 Essential (primary) hypertension: Secondary | ICD-10-CM | POA: Diagnosis not present

## 2019-04-01 DIAGNOSIS — L97822 Non-pressure chronic ulcer of other part of left lower leg with fat layer exposed: Secondary | ICD-10-CM | POA: Diagnosis not present

## 2019-04-01 DIAGNOSIS — M1710 Unilateral primary osteoarthritis, unspecified knee: Secondary | ICD-10-CM | POA: Diagnosis not present

## 2019-04-01 DIAGNOSIS — I872 Venous insufficiency (chronic) (peripheral): Secondary | ICD-10-CM | POA: Diagnosis not present

## 2019-04-01 DIAGNOSIS — I700 Atherosclerosis of aorta: Secondary | ICD-10-CM | POA: Diagnosis not present

## 2019-04-05 DIAGNOSIS — A439 Nocardiosis, unspecified: Secondary | ICD-10-CM | POA: Diagnosis not present

## 2019-04-06 DIAGNOSIS — Z7952 Long term (current) use of systemic steroids: Secondary | ICD-10-CM | POA: Diagnosis not present

## 2019-04-06 DIAGNOSIS — G7000 Myasthenia gravis without (acute) exacerbation: Secondary | ICD-10-CM | POA: Diagnosis not present

## 2019-04-06 DIAGNOSIS — G4700 Insomnia, unspecified: Secondary | ICD-10-CM | POA: Diagnosis not present

## 2019-04-06 DIAGNOSIS — Z7982 Long term (current) use of aspirin: Secondary | ICD-10-CM | POA: Diagnosis not present

## 2019-04-06 DIAGNOSIS — I872 Venous insufficiency (chronic) (peripheral): Secondary | ICD-10-CM | POA: Diagnosis not present

## 2019-04-06 DIAGNOSIS — Z9181 History of falling: Secondary | ICD-10-CM | POA: Diagnosis not present

## 2019-04-06 DIAGNOSIS — M1710 Unilateral primary osteoarthritis, unspecified knee: Secondary | ICD-10-CM | POA: Diagnosis not present

## 2019-04-06 DIAGNOSIS — Z96642 Presence of left artificial hip joint: Secondary | ICD-10-CM | POA: Diagnosis not present

## 2019-04-06 DIAGNOSIS — D150 Benign neoplasm of thymus: Secondary | ICD-10-CM | POA: Diagnosis not present

## 2019-04-06 DIAGNOSIS — I10 Essential (primary) hypertension: Secondary | ICD-10-CM | POA: Diagnosis not present

## 2019-04-06 DIAGNOSIS — Z85038 Personal history of other malignant neoplasm of large intestine: Secondary | ICD-10-CM | POA: Diagnosis not present

## 2019-04-06 DIAGNOSIS — F17210 Nicotine dependence, cigarettes, uncomplicated: Secondary | ICD-10-CM | POA: Diagnosis not present

## 2019-04-06 DIAGNOSIS — I700 Atherosclerosis of aorta: Secondary | ICD-10-CM | POA: Diagnosis not present

## 2019-04-08 DIAGNOSIS — R0600 Dyspnea, unspecified: Secondary | ICD-10-CM | POA: Diagnosis not present

## 2019-04-08 DIAGNOSIS — A439 Nocardiosis, unspecified: Secondary | ICD-10-CM | POA: Diagnosis not present

## 2019-04-08 DIAGNOSIS — N183 Chronic kidney disease, stage 3 (moderate): Secondary | ICD-10-CM | POA: Diagnosis not present

## 2019-04-08 DIAGNOSIS — I10 Essential (primary) hypertension: Secondary | ICD-10-CM | POA: Diagnosis not present

## 2019-04-16 DIAGNOSIS — G4700 Insomnia, unspecified: Secondary | ICD-10-CM | POA: Diagnosis not present

## 2019-04-16 DIAGNOSIS — G7000 Myasthenia gravis without (acute) exacerbation: Secondary | ICD-10-CM | POA: Diagnosis not present

## 2019-04-16 DIAGNOSIS — I10 Essential (primary) hypertension: Secondary | ICD-10-CM | POA: Diagnosis not present

## 2019-04-16 DIAGNOSIS — I700 Atherosclerosis of aorta: Secondary | ICD-10-CM | POA: Diagnosis not present

## 2019-04-16 DIAGNOSIS — M1710 Unilateral primary osteoarthritis, unspecified knee: Secondary | ICD-10-CM | POA: Diagnosis not present

## 2019-04-16 DIAGNOSIS — I872 Venous insufficiency (chronic) (peripheral): Secondary | ICD-10-CM | POA: Diagnosis not present

## 2019-04-29 DIAGNOSIS — I872 Venous insufficiency (chronic) (peripheral): Secondary | ICD-10-CM | POA: Diagnosis not present

## 2019-04-29 DIAGNOSIS — G4700 Insomnia, unspecified: Secondary | ICD-10-CM | POA: Diagnosis not present

## 2019-04-29 DIAGNOSIS — M1710 Unilateral primary osteoarthritis, unspecified knee: Secondary | ICD-10-CM | POA: Diagnosis not present

## 2019-04-29 DIAGNOSIS — G7000 Myasthenia gravis without (acute) exacerbation: Secondary | ICD-10-CM | POA: Diagnosis not present

## 2019-04-29 DIAGNOSIS — I10 Essential (primary) hypertension: Secondary | ICD-10-CM | POA: Diagnosis not present

## 2019-04-29 DIAGNOSIS — I700 Atherosclerosis of aorta: Secondary | ICD-10-CM | POA: Diagnosis not present

## 2019-05-06 DIAGNOSIS — I872 Venous insufficiency (chronic) (peripheral): Secondary | ICD-10-CM | POA: Diagnosis not present

## 2019-05-06 DIAGNOSIS — I700 Atherosclerosis of aorta: Secondary | ICD-10-CM | POA: Diagnosis not present

## 2019-05-06 DIAGNOSIS — I10 Essential (primary) hypertension: Secondary | ICD-10-CM | POA: Diagnosis not present

## 2019-05-06 DIAGNOSIS — G4700 Insomnia, unspecified: Secondary | ICD-10-CM | POA: Diagnosis not present

## 2019-05-06 DIAGNOSIS — M1710 Unilateral primary osteoarthritis, unspecified knee: Secondary | ICD-10-CM | POA: Diagnosis not present

## 2019-05-06 DIAGNOSIS — D150 Benign neoplasm of thymus: Secondary | ICD-10-CM | POA: Diagnosis not present

## 2019-05-06 DIAGNOSIS — Z7982 Long term (current) use of aspirin: Secondary | ICD-10-CM | POA: Diagnosis not present

## 2019-05-06 DIAGNOSIS — Z85038 Personal history of other malignant neoplasm of large intestine: Secondary | ICD-10-CM | POA: Diagnosis not present

## 2019-05-06 DIAGNOSIS — F17210 Nicotine dependence, cigarettes, uncomplicated: Secondary | ICD-10-CM | POA: Diagnosis not present

## 2019-05-06 DIAGNOSIS — Z96642 Presence of left artificial hip joint: Secondary | ICD-10-CM | POA: Diagnosis not present

## 2019-05-06 DIAGNOSIS — G7000 Myasthenia gravis without (acute) exacerbation: Secondary | ICD-10-CM | POA: Diagnosis not present

## 2019-05-06 DIAGNOSIS — Z9181 History of falling: Secondary | ICD-10-CM | POA: Diagnosis not present

## 2019-05-06 DIAGNOSIS — Z7952 Long term (current) use of systemic steroids: Secondary | ICD-10-CM | POA: Diagnosis not present

## 2019-05-08 DIAGNOSIS — R0602 Shortness of breath: Secondary | ICD-10-CM | POA: Diagnosis not present

## 2019-05-08 DIAGNOSIS — I10 Essential (primary) hypertension: Secondary | ICD-10-CM | POA: Diagnosis not present

## 2019-05-08 DIAGNOSIS — Z9049 Acquired absence of other specified parts of digestive tract: Secondary | ICD-10-CM | POA: Diagnosis not present

## 2019-05-08 DIAGNOSIS — R0609 Other forms of dyspnea: Secondary | ICD-10-CM | POA: Diagnosis not present

## 2019-05-08 DIAGNOSIS — R0600 Dyspnea, unspecified: Secondary | ICD-10-CM | POA: Diagnosis not present

## 2019-05-08 DIAGNOSIS — Z9221 Personal history of antineoplastic chemotherapy: Secondary | ICD-10-CM | POA: Diagnosis not present

## 2019-05-08 DIAGNOSIS — D649 Anemia, unspecified: Secondary | ICD-10-CM | POA: Diagnosis not present

## 2019-05-08 DIAGNOSIS — D150 Benign neoplasm of thymus: Secondary | ICD-10-CM | POA: Diagnosis not present

## 2019-05-08 DIAGNOSIS — L821 Other seborrheic keratosis: Secondary | ICD-10-CM | POA: Diagnosis not present

## 2019-05-08 DIAGNOSIS — Z96642 Presence of left artificial hip joint: Secondary | ICD-10-CM | POA: Diagnosis not present

## 2019-05-08 DIAGNOSIS — Z792 Long term (current) use of antibiotics: Secondary | ICD-10-CM | POA: Diagnosis not present

## 2019-05-08 DIAGNOSIS — Z872 Personal history of diseases of the skin and subcutaneous tissue: Secondary | ICD-10-CM | POA: Diagnosis not present

## 2019-05-08 DIAGNOSIS — Z87891 Personal history of nicotine dependence: Secondary | ICD-10-CM | POA: Diagnosis not present

## 2019-05-08 DIAGNOSIS — R600 Localized edema: Secondary | ICD-10-CM | POA: Diagnosis not present

## 2019-05-08 DIAGNOSIS — G7000 Myasthenia gravis without (acute) exacerbation: Secondary | ICD-10-CM | POA: Diagnosis not present

## 2019-05-08 DIAGNOSIS — A439 Nocardiosis, unspecified: Secondary | ICD-10-CM | POA: Diagnosis not present

## 2019-05-08 DIAGNOSIS — Z85038 Personal history of other malignant neoplasm of large intestine: Secondary | ICD-10-CM | POA: Diagnosis not present

## 2019-05-08 DIAGNOSIS — A431 Cutaneous nocardiosis: Secondary | ICD-10-CM | POA: Diagnosis not present

## 2019-05-13 DIAGNOSIS — M1710 Unilateral primary osteoarthritis, unspecified knee: Secondary | ICD-10-CM | POA: Diagnosis not present

## 2019-05-13 DIAGNOSIS — I700 Atherosclerosis of aorta: Secondary | ICD-10-CM | POA: Diagnosis not present

## 2019-05-13 DIAGNOSIS — I872 Venous insufficiency (chronic) (peripheral): Secondary | ICD-10-CM | POA: Diagnosis not present

## 2019-05-13 DIAGNOSIS — G4700 Insomnia, unspecified: Secondary | ICD-10-CM | POA: Diagnosis not present

## 2019-05-13 DIAGNOSIS — G7000 Myasthenia gravis without (acute) exacerbation: Secondary | ICD-10-CM | POA: Diagnosis not present

## 2019-05-13 DIAGNOSIS — I10 Essential (primary) hypertension: Secondary | ICD-10-CM | POA: Diagnosis not present

## 2019-05-14 DIAGNOSIS — R0600 Dyspnea, unspecified: Secondary | ICD-10-CM | POA: Diagnosis not present

## 2019-05-14 DIAGNOSIS — N183 Chronic kidney disease, stage 3 (moderate): Secondary | ICD-10-CM | POA: Diagnosis not present

## 2019-05-14 DIAGNOSIS — G7000 Myasthenia gravis without (acute) exacerbation: Secondary | ICD-10-CM | POA: Diagnosis not present

## 2019-05-14 DIAGNOSIS — A439 Nocardiosis, unspecified: Secondary | ICD-10-CM | POA: Diagnosis not present

## 2019-05-15 DIAGNOSIS — R0600 Dyspnea, unspecified: Secondary | ICD-10-CM | POA: Diagnosis not present

## 2019-05-15 DIAGNOSIS — I10 Essential (primary) hypertension: Secondary | ICD-10-CM | POA: Diagnosis not present

## 2019-05-15 DIAGNOSIS — R0609 Other forms of dyspnea: Secondary | ICD-10-CM | POA: Diagnosis not present

## 2019-05-16 DIAGNOSIS — I700 Atherosclerosis of aorta: Secondary | ICD-10-CM | POA: Diagnosis not present

## 2019-05-16 DIAGNOSIS — G4700 Insomnia, unspecified: Secondary | ICD-10-CM | POA: Diagnosis not present

## 2019-05-16 DIAGNOSIS — G7000 Myasthenia gravis without (acute) exacerbation: Secondary | ICD-10-CM | POA: Diagnosis not present

## 2019-05-16 DIAGNOSIS — I872 Venous insufficiency (chronic) (peripheral): Secondary | ICD-10-CM | POA: Diagnosis not present

## 2019-05-16 DIAGNOSIS — M1710 Unilateral primary osteoarthritis, unspecified knee: Secondary | ICD-10-CM | POA: Diagnosis not present

## 2019-05-16 DIAGNOSIS — I10 Essential (primary) hypertension: Secondary | ICD-10-CM | POA: Diagnosis not present

## 2019-06-10 DIAGNOSIS — R0600 Dyspnea, unspecified: Secondary | ICD-10-CM | POA: Diagnosis not present

## 2019-06-17 DIAGNOSIS — R0600 Dyspnea, unspecified: Secondary | ICD-10-CM | POA: Diagnosis not present

## 2019-07-05 DIAGNOSIS — D150 Benign neoplasm of thymus: Secondary | ICD-10-CM | POA: Diagnosis not present

## 2019-07-05 DIAGNOSIS — G7000 Myasthenia gravis without (acute) exacerbation: Secondary | ICD-10-CM | POA: Diagnosis not present

## 2019-07-05 DIAGNOSIS — A439 Nocardiosis, unspecified: Secondary | ICD-10-CM | POA: Diagnosis not present

## 2019-07-05 DIAGNOSIS — Z7952 Long term (current) use of systemic steroids: Secondary | ICD-10-CM | POA: Diagnosis not present

## 2019-07-23 DIAGNOSIS — R0609 Other forms of dyspnea: Secondary | ICD-10-CM | POA: Diagnosis not present

## 2019-08-04 ENCOUNTER — Other Ambulatory Visit: Payer: Self-pay | Admitting: Family Medicine

## 2019-08-06 DIAGNOSIS — R0600 Dyspnea, unspecified: Secondary | ICD-10-CM | POA: Diagnosis not present

## 2019-08-28 DIAGNOSIS — Z23 Encounter for immunization: Secondary | ICD-10-CM | POA: Diagnosis not present

## 2019-10-18 ENCOUNTER — Other Ambulatory Visit: Payer: Self-pay | Admitting: Family Medicine

## 2019-10-23 ENCOUNTER — Other Ambulatory Visit: Payer: Self-pay

## 2019-11-06 ENCOUNTER — Telehealth: Payer: Self-pay

## 2019-11-06 NOTE — Telephone Encounter (Signed)
Called to to inquire about a telephonic AWV and pt declined and states that he had moved and was no longer a pt at our clinic. Future apts cancelled. FYI!

## 2019-11-19 ENCOUNTER — Ambulatory Visit: Payer: Medicare Other

## 2019-11-19 ENCOUNTER — Encounter: Payer: Medicare Other | Admitting: Family Medicine

## 2019-11-29 IMAGING — CT CT ANGIO CHEST
2 of 7 series · 13 of 36 positions shown · IV contrast (iopamidol)
Comparison: Chest CT November 04, 2004

CLINICAL DATA: Shortness of breath. History of colon carcinoma and
myasthenia gravis

EXAM:
CT ANGIOGRAPHY CHEST WITH CONTRAST
TECHNIQUE: Multidetector CT imaging of the chest was performed using the
standard protocol during bolus administration of intravenous
contrast. Multiplanar CT image reconstructions and MIPs were
obtained to evaluate the vascular anatomy.
CONTRAST:  75mL V0W2L4-6D6 IOPAMIDOL (V0W2L4-6D6) INJECTION 76%

[Series 17: thins cta pulmonary · axial · 0.75mm/px · z∈[-1178,-888]mm · 12 of 344 slices shown]
[im 27/344  lung]
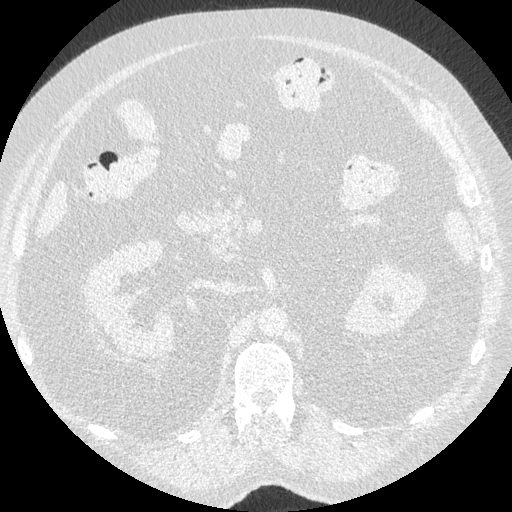
[im 53/344  mediastinal]
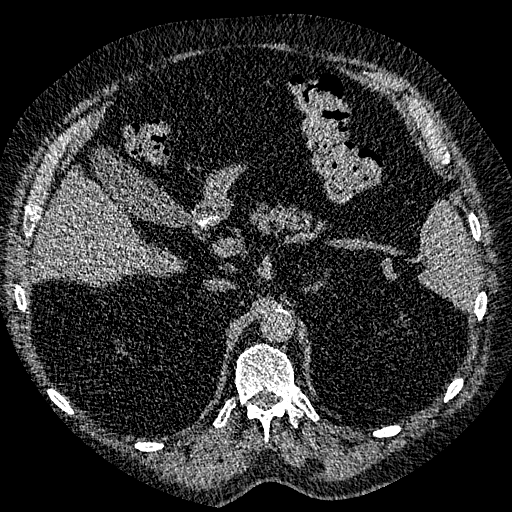
[im 80/344  lung]
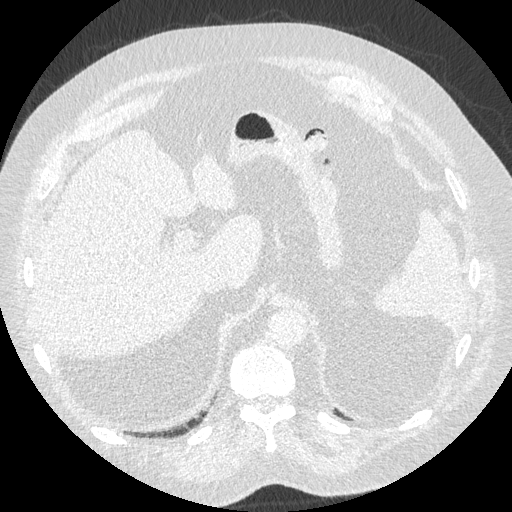
[im 106/344  mediastinal]
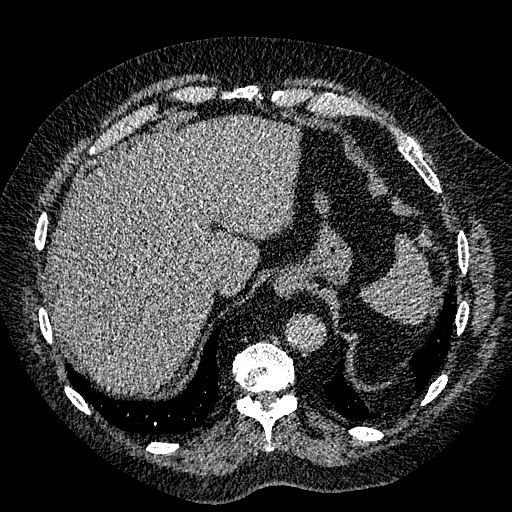
[im 132/344  lung]
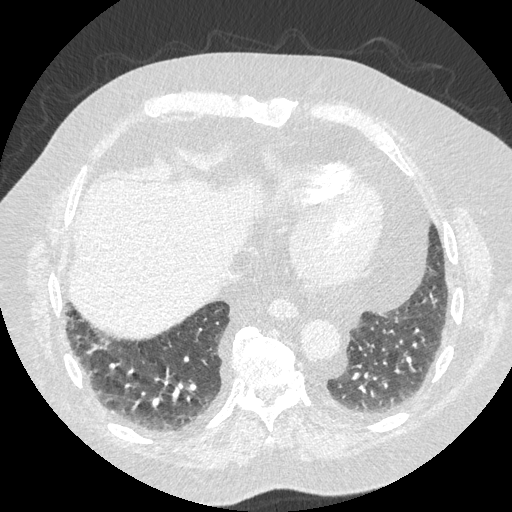
[im 159/344  mediastinal]
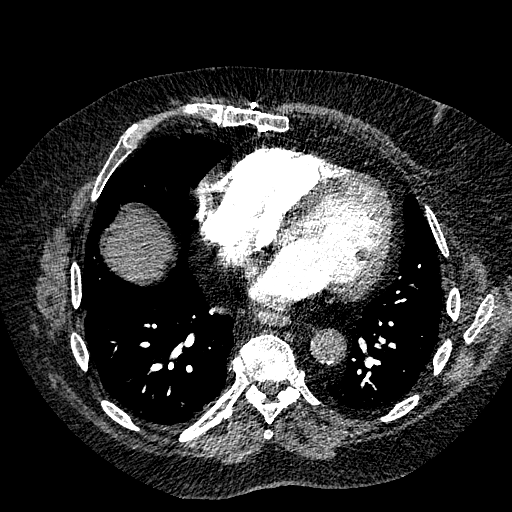
[im 185/344  lung]
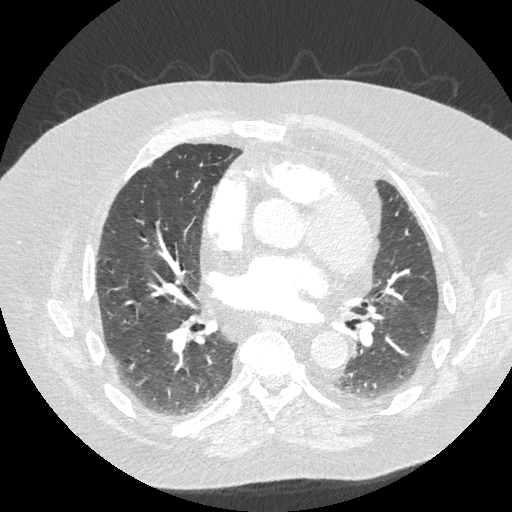
[im 212/344  mediastinal]
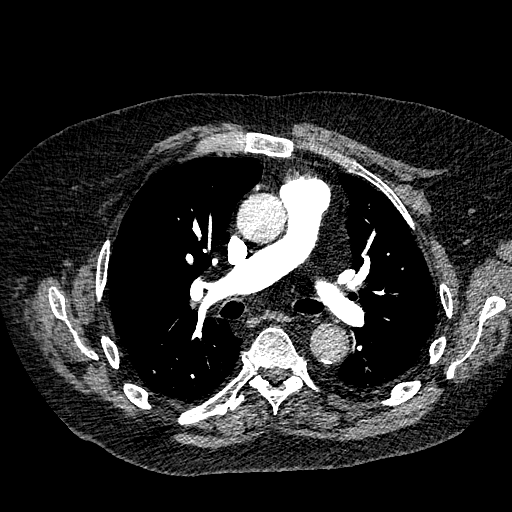
[im 238/344  lung]
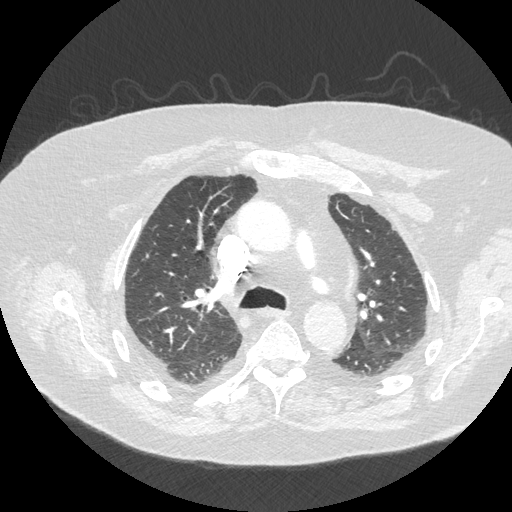
[im 264/344  mediastinal]
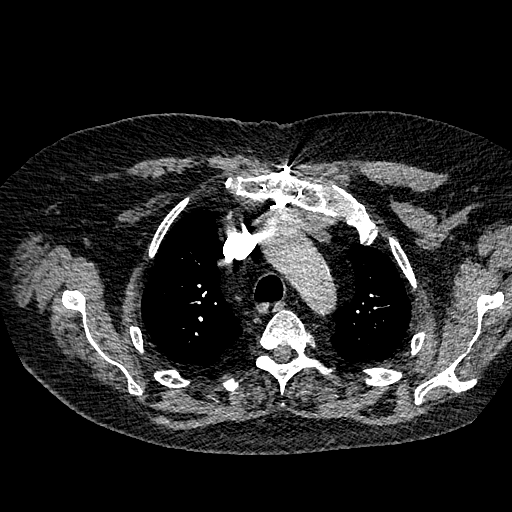
[im 291/344  lung]
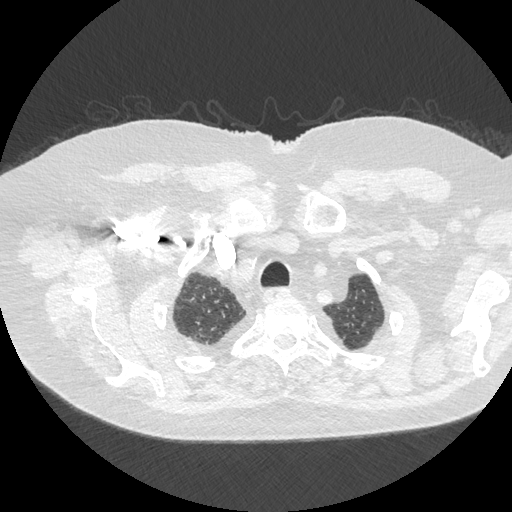
[im 317/344  mediastinal]
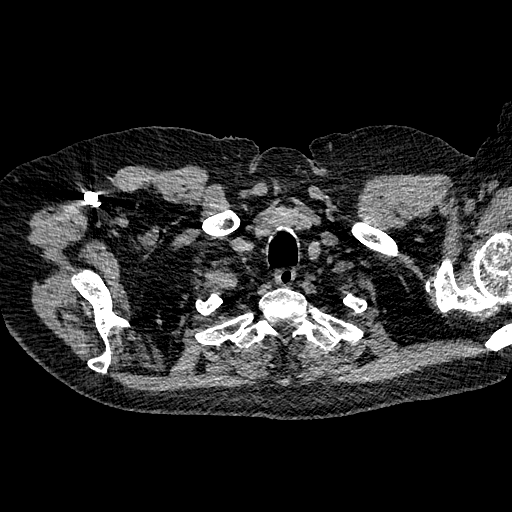

[Series 20: cta pulmonary · coronal · 0.67mm/px · 1 of 190 slices shown]
[im 95/190  mediastinal]
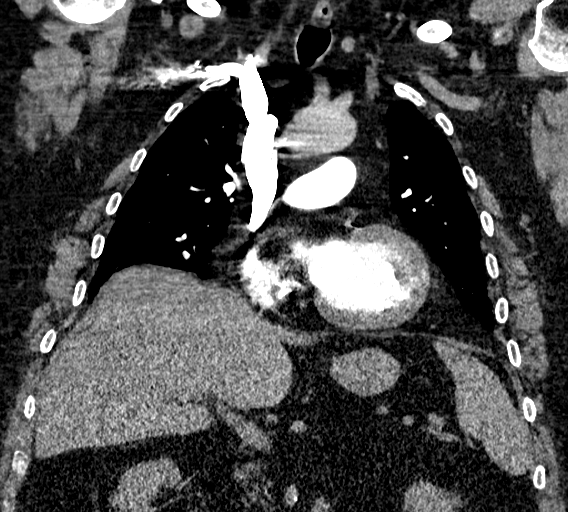

[13 of 36 positions shown; findings below may reference images not displayed]

FINDINGS: Cardiovascular: There is no demonstrable pulmonary embolus. There is
no thoracic aortic aneurysm. No dissection is seen. Note that the
contrast bolus is not optimal for dissection assessment. Visualized
great vessels appear unremarkable except for mild calcification at
the origins of the right innominate and left common carotid
arteries. There is aortic atherosclerosis. There are scattered foci
of coronary artery calcification. There is no pericardial effusion
or pericardial thickening.

Mediastinum/Nodes: Visualized thyroid appears normal. A previously
noted left-sided anterior mediastinal mass has been removed since
previous study with surgical clips in this area. There is currently
no demonstrable mediastinal mass. No adenopathy is noted in the
thoracic region. No esophageal lesions are appreciable.

Lungs/Pleura: There is mild lower lobe atelectatic change. There is
a small foramen of Bochdalek hernia on the left posteriorly. There
is mild benign pleural thickening in the lung apex regions which is
stable. There is no evident edema or consolidation. No pleural
effusion evident.

:
Upper abdomen: There is aortic atherosclerosis. There are scattered
colonic diverticula. Visualized upper abdominal structures appear
unremarkable.

Musculoskeletal: Patient is status post median sternotomy. There are
no blastic or lytic bone lesions. No chest wall lesions are evident.
IMPRESSION: 1. No demonstrable pulmonary embolus. No thoracic aortic aneurysm.
No dissection evident, although the contrast bolus is less than
optimal for dissection assessment. There is aortic atherosclerosis
as well as foci of proximal great vessel calcification and coronary
artery calcification.

2. Surgical clips in the anterior mediastinum, presumably from
previous thymoma resection. Currently no mediastinal mass or
adenopathy evident.

3. No lung edema or consolidation. Areas of mild benign pleural
thickening near the apices. Small foramen of Bochdalek hernia on the
left posteriorly.

4. Scattered transverse colonic diverticula without diverticulitis
evident.

Aortic Atherosclerosis (28TRA-4QL.L).

## 2020-01-19 ENCOUNTER — Other Ambulatory Visit: Payer: Self-pay | Admitting: Family Medicine

## 2020-05-20 DIAGNOSIS — G7000 Myasthenia gravis without (acute) exacerbation: Secondary | ICD-10-CM | POA: Diagnosis not present

## 2020-05-20 DIAGNOSIS — D4989 Neoplasm of unspecified behavior of other specified sites: Secondary | ICD-10-CM | POA: Diagnosis not present

## 2020-08-06 ENCOUNTER — Other Ambulatory Visit: Payer: Self-pay | Admitting: Family Medicine

## 2020-08-06 NOTE — Telephone Encounter (Signed)
Requested  medications are  due for refill today yes  Requested medications are on the active medication list yes  Last refill 6/13  Future visit scheduled No  Notes to clinic Telephone visit states that pt has moved and not a pt of this practice any longer.

## 2020-08-25 ENCOUNTER — Other Ambulatory Visit: Payer: Self-pay | Admitting: Family Medicine

## 2020-09-01 DIAGNOSIS — A439 Nocardiosis, unspecified: Secondary | ICD-10-CM | POA: Diagnosis not present

## 2020-09-01 DIAGNOSIS — G4700 Insomnia, unspecified: Secondary | ICD-10-CM | POA: Diagnosis not present

## 2020-09-01 DIAGNOSIS — R0600 Dyspnea, unspecified: Secondary | ICD-10-CM | POA: Diagnosis not present

## 2020-09-01 DIAGNOSIS — I739 Peripheral vascular disease, unspecified: Secondary | ICD-10-CM | POA: Diagnosis not present

## 2020-10-09 DIAGNOSIS — Z125 Encounter for screening for malignant neoplasm of prostate: Secondary | ICD-10-CM | POA: Diagnosis not present

## 2020-10-09 DIAGNOSIS — R197 Diarrhea, unspecified: Secondary | ICD-10-CM | POA: Diagnosis not present

## 2020-10-09 DIAGNOSIS — A439 Nocardiosis, unspecified: Secondary | ICD-10-CM | POA: Diagnosis not present

## 2020-10-09 DIAGNOSIS — G7000 Myasthenia gravis without (acute) exacerbation: Secondary | ICD-10-CM | POA: Diagnosis not present

## 2020-10-09 DIAGNOSIS — C187 Malignant neoplasm of sigmoid colon: Secondary | ICD-10-CM | POA: Diagnosis not present

## 2020-10-09 DIAGNOSIS — E119 Type 2 diabetes mellitus without complications: Secondary | ICD-10-CM | POA: Diagnosis not present

## 2020-10-12 DIAGNOSIS — C61 Malignant neoplasm of prostate: Secondary | ICD-10-CM | POA: Diagnosis not present

## 2020-10-12 DIAGNOSIS — R0600 Dyspnea, unspecified: Secondary | ICD-10-CM | POA: Diagnosis not present

## 2020-10-12 DIAGNOSIS — A0472 Enterocolitis due to Clostridium difficile, not specified as recurrent: Secondary | ICD-10-CM | POA: Diagnosis not present

## 2020-10-12 DIAGNOSIS — C189 Malignant neoplasm of colon, unspecified: Secondary | ICD-10-CM | POA: Diagnosis not present

## 2020-11-02 DIAGNOSIS — R197 Diarrhea, unspecified: Secondary | ICD-10-CM | POA: Diagnosis not present

## 2020-11-02 DIAGNOSIS — C61 Malignant neoplasm of prostate: Secondary | ICD-10-CM | POA: Diagnosis not present

## 2020-11-02 DIAGNOSIS — C189 Malignant neoplasm of colon, unspecified: Secondary | ICD-10-CM | POA: Diagnosis not present

## 2020-11-02 DIAGNOSIS — A0472 Enterocolitis due to Clostridium difficile, not specified as recurrent: Secondary | ICD-10-CM | POA: Diagnosis not present

## 2020-11-24 DIAGNOSIS — Z23 Encounter for immunization: Secondary | ICD-10-CM | POA: Diagnosis not present

## 2020-12-29 DEATH — deceased
